# Patient Record
Sex: Female | Born: 1970 | Race: White | Hispanic: No | Marital: Married | Smoking: Never smoker
Health system: Southern US, Community
[De-identification: ages and names within clinical notes are randomized; demographics above are authoritative.]

## PROBLEM LIST (undated history)

## (undated) DIAGNOSIS — R1012 Left upper quadrant pain: Secondary | ICD-10-CM

## (undated) DIAGNOSIS — M7989 Other specified soft tissue disorders: Secondary | ICD-10-CM

## (undated) DIAGNOSIS — D1800 Hemangioma unspecified site: Secondary | ICD-10-CM

## (undated) DIAGNOSIS — M2550 Pain in unspecified joint: Secondary | ICD-10-CM

## (undated) DIAGNOSIS — J302 Other seasonal allergic rhinitis: Secondary | ICD-10-CM

## (undated) DIAGNOSIS — R519 Headache, unspecified: Secondary | ICD-10-CM

## (undated) DIAGNOSIS — K5790 Diverticulosis of intestine, part unspecified, without perforation or abscess without bleeding: Secondary | ICD-10-CM

## (undated) DIAGNOSIS — I493 Ventricular premature depolarization: Secondary | ICD-10-CM

## (undated) DIAGNOSIS — M1990 Unspecified osteoarthritis, unspecified site: Secondary | ICD-10-CM

## (undated) DIAGNOSIS — Z9889 Other specified postprocedural states: Secondary | ICD-10-CM

## (undated) DIAGNOSIS — N390 Urinary tract infection, site not specified: Secondary | ICD-10-CM

## (undated) DIAGNOSIS — R609 Edema, unspecified: Secondary | ICD-10-CM

## (undated) DIAGNOSIS — E782 Mixed hyperlipidemia: Secondary | ICD-10-CM

## (undated) DIAGNOSIS — I7300 Raynaud's syndrome without gangrene: Secondary | ICD-10-CM

## (undated) DIAGNOSIS — K5792 Diverticulitis of intestine, part unspecified, without perforation or abscess without bleeding: Secondary | ICD-10-CM

## (undated) DIAGNOSIS — I8390 Asymptomatic varicose veins of unspecified lower extremity: Secondary | ICD-10-CM

## (undated) DIAGNOSIS — K760 Fatty (change of) liver, not elsewhere classified: Secondary | ICD-10-CM

## (undated) DIAGNOSIS — K219 Gastro-esophageal reflux disease without esophagitis: Secondary | ICD-10-CM

## (undated) DIAGNOSIS — K829 Disease of gallbladder, unspecified: Secondary | ICD-10-CM

## (undated) DIAGNOSIS — F439 Reaction to severe stress, unspecified: Secondary | ICD-10-CM

## (undated) DIAGNOSIS — K068 Other specified disorders of gingiva and edentulous alveolar ridge: Secondary | ICD-10-CM

## (undated) DIAGNOSIS — N63 Unspecified lump in unspecified breast: Secondary | ICD-10-CM

## (undated) DIAGNOSIS — L659 Nonscarring hair loss, unspecified: Secondary | ICD-10-CM

## (undated) DIAGNOSIS — K625 Hemorrhage of anus and rectum: Secondary | ICD-10-CM

## (undated) DIAGNOSIS — Z87442 Personal history of urinary calculi: Secondary | ICD-10-CM

## (undated) DIAGNOSIS — R112 Nausea with vomiting, unspecified: Secondary | ICD-10-CM

## (undated) DIAGNOSIS — R51 Headache: Secondary | ICD-10-CM

## (undated) DIAGNOSIS — K649 Unspecified hemorrhoids: Secondary | ICD-10-CM

## (undated) HISTORY — DX: Diverticulosis of intestine, part unspecified, without perforation or abscess without bleeding: K57.90

## (undated) HISTORY — DX: Unspecified lump in unspecified breast: N63.0

## (undated) HISTORY — DX: Gastro-esophageal reflux disease without esophagitis: K21.9

## (undated) HISTORY — DX: Disease of gallbladder, unspecified: K82.9

## (undated) HISTORY — DX: Raynaud's syndrome without gangrene: I73.00

## (undated) HISTORY — DX: Other specified soft tissue disorders: M79.89

## (undated) HISTORY — DX: Left upper quadrant pain: R10.12

## (undated) HISTORY — DX: Pain in unspecified joint: M25.50

## (undated) HISTORY — DX: Ventricular premature depolarization: I49.3

## (undated) HISTORY — PX: CHOLECYSTECTOMY: SHX55

## (undated) HISTORY — DX: Unspecified hemorrhoids: K64.9

## (undated) HISTORY — DX: Reaction to severe stress, unspecified: F43.9

## (undated) HISTORY — DX: Edema, unspecified: R60.9

## (undated) HISTORY — DX: Diverticulitis of intestine, part unspecified, without perforation or abscess without bleeding: K57.92

## (undated) HISTORY — DX: Other specified disorders of gingiva and edentulous alveolar ridge: K06.8

## (undated) HISTORY — DX: Asymptomatic varicose veins of unspecified lower extremity: I83.90

## (undated) HISTORY — DX: Fatty (change of) liver, not elsewhere classified: K76.0

## (undated) HISTORY — PX: OTHER SURGICAL HISTORY: SHX169

## (undated) HISTORY — DX: Hemangioma unspecified site: D18.00

## (undated) HISTORY — DX: Urinary tract infection, site not specified: N39.0

## (undated) HISTORY — DX: Mixed hyperlipidemia: E78.2

## (undated) HISTORY — DX: Nonscarring hair loss, unspecified: L65.9

## (undated) HISTORY — DX: Hemorrhage of anus and rectum: K62.5

---

## 2009-07-13 ENCOUNTER — Other Ambulatory Visit: Admission: RE | Admit: 2009-07-13 | Discharge: 2009-07-13 | Payer: Self-pay | Admitting: Obstetrics and Gynecology

## 2011-01-08 ENCOUNTER — Ambulatory Visit (INDEPENDENT_AMBULATORY_CARE_PROVIDER_SITE_OTHER): Payer: Self-pay | Admitting: Internal Medicine

## 2011-01-08 ENCOUNTER — Encounter (INDEPENDENT_AMBULATORY_CARE_PROVIDER_SITE_OTHER): Payer: Self-pay | Admitting: Internal Medicine

## 2011-01-08 ENCOUNTER — Ambulatory Visit (INDEPENDENT_AMBULATORY_CARE_PROVIDER_SITE_OTHER): Payer: BC Managed Care – PPO | Admitting: Internal Medicine

## 2011-01-08 ENCOUNTER — Other Ambulatory Visit (INDEPENDENT_AMBULATORY_CARE_PROVIDER_SITE_OTHER): Payer: Self-pay | Admitting: *Deleted

## 2011-01-08 ENCOUNTER — Encounter (INDEPENDENT_AMBULATORY_CARE_PROVIDER_SITE_OTHER): Payer: Self-pay | Admitting: *Deleted

## 2011-01-08 VITALS — BP 132/70 | HR 80 | Temp 98.4°F | Ht 64.0 in | Wt 167.7 lb

## 2011-01-08 DIAGNOSIS — K625 Hemorrhage of anus and rectum: Secondary | ICD-10-CM

## 2011-01-08 DIAGNOSIS — Z800 Family history of malignant neoplasm of digestive organs: Secondary | ICD-10-CM

## 2011-01-08 DIAGNOSIS — D739 Disease of spleen, unspecified: Secondary | ICD-10-CM

## 2011-01-08 NOTE — Patient Instructions (Signed)
Follow up pending colonoscopy

## 2011-01-08 NOTE — Progress Notes (Signed)
Subjective:     Patient ID: Holly Golden, female   DOB: October 15, 1970, 40 y.o.   MRN: [id number] Holly Golden is a 40 yr old female referred to our office by Holly Mourning NP for heme positive stools. She say Holly Golden last week and she was guaiac positive in the office. Holly Golden tells me that she is having rectal bleeding. She says some weeks she will have rectal bleeding 3-4 times a week. The bleeding use to only occur when she was constipated but now will occur even when she is not.  She sees the blood in the commode and she has dripped blood on the floor. She sees blood in the stool also. Always bright red rectal bleeding.  Grandmother had colon cancer in early 21s.  She has a 1st cousin newly diagnosed with colon cancer at age 81, stage 4, new diagnosis.  Appetite is good. No weight loss. No abdominal pain. Acid reflux frequently and she drinks milk.  No melena. HPI see hpi   Review of Systems see hpi No current outpatient prescriptions on file.   History reviewed. No pertinent past medical history. Past Surgical History  Procedure Date  . Cesarean section     1991  . Left tube and ovary removed for a benign mass.   . Cholecystectomy     2010  . Splenic hemangiomas    No Known Allergies History   Social History  . Marital Status: Unknown    Spouse Name: N/A    Number of Children: N/A  . Years of Education: N/A   Occupational History  . Not on file.   Social History Main Topics  . Smoking status: Never Smoker   . Smokeless tobacco: Not on file  . Alcohol Use: No  . Drug Use: No  . Sexually Active: Not on file   Other Topics Concern  . Not on file   Social History Narrative  . No narrative on file   History reviewed. No pertinent family history. Family Status  Relation Status Death Age  . Mother Alive     good health  . Father Alive     good health  . Brother Alive     seizures MVC. blood clots   No Known Allergies     Objective:   Physical Exam Filed Vitals:   01/08/11 1611  BP: 132/70  Pulse: 80  Temp: 98.4 F (36.9 C)  Height: 5\' 4"  (1.626 m)  Weight: 167 lb 11.2 oz (76.068 kg)    Alert and oriented. Skin warm and dry. Oral mucosa is moist. Natural teeth in good condition. Sclera anicteric, conjunctivae is pink. Thyroid not enlarged. No cervical lymphadenopathy. Lungs clear. Heart regular rate and rhythm.  Abdomen is soft. Bowel sounds are positive. No hepatomegaly. No abdominal masses felt. No tenderness.  No edema to lower extremities. Patient is alert and oriented.     Assessment:   Rectal bleeding. Colonic neoplasm, polyp, hemorrhoid, AVM need to be ruled. Family hx of colon cancer.    Plan:     Colonoscopy.The risks and benefits such as perforation, bleeding, and infection were reviewed with the patient and is agreeable. Patient is satisfied with the care she received today.

## 2011-01-09 ENCOUNTER — Other Ambulatory Visit (INDEPENDENT_AMBULATORY_CARE_PROVIDER_SITE_OTHER): Payer: Self-pay | Admitting: *Deleted

## 2011-01-09 DIAGNOSIS — K625 Hemorrhage of anus and rectum: Secondary | ICD-10-CM

## 2011-01-09 DIAGNOSIS — Z800 Family history of malignant neoplasm of digestive organs: Secondary | ICD-10-CM

## 2011-02-08 ENCOUNTER — Ambulatory Visit: Admit: 2011-02-08 | Payer: Self-pay | Admitting: Internal Medicine

## 2011-02-08 SURGERY — COLONOSCOPY
Anesthesia: Moderate Sedation

## 2011-02-12 ENCOUNTER — Encounter (INDEPENDENT_AMBULATORY_CARE_PROVIDER_SITE_OTHER): Payer: Self-pay | Admitting: *Deleted

## 2011-03-01 MED ORDER — BACITRACIN ZINC 500 UNIT/GM EX OINT
TOPICAL_OINTMENT | CUTANEOUS | Status: AC
  Filled 2011-03-01: qty 2.7

## 2011-03-01 MED ORDER — EPINEPHRINE HCL 1 MG/ML IJ SOLN
INTRAMUSCULAR | Status: AC
  Filled 2011-03-01: qty 1

## 2011-03-01 MED ORDER — CIPROFLOXACIN-DEXAMETHASONE 0.3-0.1 % OT SUSP
OTIC | Status: AC
  Filled 2011-03-01: qty 7.5

## 2011-03-01 MED ORDER — LIDOCAINE-EPINEPHRINE (PF) 1 %-1:200000 IJ SOLN
INTRAMUSCULAR | Status: AC
  Filled 2011-03-01: qty 10

## 2011-03-01 MED ORDER — OXYMETAZOLINE HCL 0.05 % NA SOLN
NASAL | Status: AC
  Filled 2011-03-01: qty 15

## 2011-03-28 MED ORDER — SODIUM CHLORIDE 0.45 % IV SOLN: Freq: Once | INTRAVENOUS | Status: DC

## 2011-03-29 ENCOUNTER — Ambulatory Visit (HOSPITAL_COMMUNITY)
Admission: RE | Admit: 2011-03-29 | Payer: BC Managed Care – PPO | Source: Ambulatory Visit | Admitting: Internal Medicine

## 2011-03-29 ENCOUNTER — Encounter (HOSPITAL_COMMUNITY): Admission: RE | Payer: Self-pay | Source: Ambulatory Visit

## 2011-03-29 SURGERY — COLONOSCOPY
Anesthesia: Moderate Sedation

## 2011-06-12 ENCOUNTER — Other Ambulatory Visit (INDEPENDENT_AMBULATORY_CARE_PROVIDER_SITE_OTHER): Payer: Self-pay | Admitting: *Deleted

## 2011-06-12 ENCOUNTER — Telehealth (INDEPENDENT_AMBULATORY_CARE_PROVIDER_SITE_OTHER): Payer: Self-pay | Admitting: *Deleted

## 2011-06-12 DIAGNOSIS — Z8719 Personal history of other diseases of the digestive system: Secondary | ICD-10-CM

## 2011-06-12 DIAGNOSIS — Z800 Family history of malignant neoplasm of digestive organs: Secondary | ICD-10-CM

## 2011-06-12 NOTE — Telephone Encounter (Signed)
Patient needs movi prep 

## 2011-06-13 MED ORDER — PEG-KCL-NACL-NASULF-NA ASC-C 100 G PO SOLR: 1.0000 | Freq: Once | ORAL | Status: DC

## 2011-07-18 ENCOUNTER — Telehealth (INDEPENDENT_AMBULATORY_CARE_PROVIDER_SITE_OTHER): Payer: Self-pay | Admitting: *Deleted

## 2011-07-18 NOTE — Telephone Encounter (Signed)
PCP/Requesting MD: howard  Name & DOB: Holly Golden 05-May-2070     Procedure: tcs  Reason/Indication:  fam hx colon ca, hx rectal bleeding  Has patient had this procedure before?  yes  If so, when, by whom and where?  More than 10 yrs ago  Is there a family history of colon cancer?  yes  Who?  What age when diagnosed?  Grandmother, cousin  Is patient diabetic?   no      Does patient have prosthetic heart valve?  no  Do you have a pacemaker?  no  Has patient had joint replacement within last 12 months?  no  Is patient on Coumadin, Plavix and/or Aspirin? no  Medications: advil prn  Allergies: nkda  Medication Adjustment:   Procedure date & time: 08/06/11 @ 730

## 2011-07-24 NOTE — Telephone Encounter (Signed)
agree

## 2011-08-02 ENCOUNTER — Encounter (HOSPITAL_COMMUNITY): Payer: Self-pay | Admitting: Pharmacy Technician

## 2011-08-02 ENCOUNTER — Other Ambulatory Visit: Payer: Self-pay | Admitting: Adult Health

## 2011-08-02 DIAGNOSIS — Z139 Encounter for screening, unspecified: Secondary | ICD-10-CM

## 2011-08-06 ENCOUNTER — Encounter (HOSPITAL_COMMUNITY): Payer: Self-pay | Admitting: *Deleted

## 2011-08-06 ENCOUNTER — Ambulatory Visit (HOSPITAL_COMMUNITY)
Admission: RE | Admit: 2011-08-06 | Discharge: 2011-08-06 | Disposition: A | Discharge status: Home / Self Care | Payer: BC Managed Care – PPO | Source: Ambulatory Visit | Attending: Internal Medicine | Admitting: Internal Medicine

## 2011-08-06 ENCOUNTER — Encounter (HOSPITAL_COMMUNITY)
Admission: RE | Disposition: A | Discharge status: Home / Self Care | Payer: Self-pay | Source: Ambulatory Visit | Attending: Internal Medicine

## 2011-08-06 DIAGNOSIS — Z800 Family history of malignant neoplasm of digestive organs: Secondary | ICD-10-CM

## 2011-08-06 DIAGNOSIS — D129 Benign neoplasm of anus and anal canal: Secondary | ICD-10-CM | POA: Insufficient documentation

## 2011-08-06 DIAGNOSIS — K921 Melena: Secondary | ICD-10-CM | POA: Insufficient documentation

## 2011-08-06 DIAGNOSIS — D126 Benign neoplasm of colon, unspecified: Secondary | ICD-10-CM | POA: Insufficient documentation

## 2011-08-06 DIAGNOSIS — K644 Residual hemorrhoidal skin tags: Secondary | ICD-10-CM

## 2011-08-06 DIAGNOSIS — K625 Hemorrhage of anus and rectum: Secondary | ICD-10-CM

## 2011-08-06 DIAGNOSIS — Z8719 Personal history of other diseases of the digestive system: Secondary | ICD-10-CM

## 2011-08-06 DIAGNOSIS — D128 Benign neoplasm of rectum: Secondary | ICD-10-CM | POA: Insufficient documentation

## 2011-08-06 HISTORY — PX: COLONOSCOPY: SHX5424

## 2011-08-06 HISTORY — DX: Other seasonal allergic rhinitis: J30.2

## 2011-08-06 SURGERY — COLONOSCOPY
Anesthesia: Moderate Sedation

## 2011-08-06 MED ORDER — MIDAZOLAM HCL 5 MG/5ML IJ SOLN
INTRAMUSCULAR | Status: DC | PRN
  Administered 2011-08-06: 1 mg via INTRAVENOUS
  Administered 2011-08-06: 2 mg via INTRAVENOUS
  Administered 2011-08-06 (×2): 1 mg via INTRAVENOUS
  Administered 2011-08-06: 2 mg via INTRAVENOUS

## 2011-08-06 MED ORDER — MIDAZOLAM HCL 5 MG/5ML IJ SOLN
INTRAMUSCULAR | Status: AC
  Filled 2011-08-06: qty 10

## 2011-08-06 MED ORDER — MEPERIDINE HCL 50 MG/ML IJ SOLN
INTRAMUSCULAR | Status: DC | PRN
  Administered 2011-08-06 (×2): 25 mg via INTRAVENOUS

## 2011-08-06 MED ORDER — BENEFIBER PO PACK: 1.0000 | PACK | Freq: Every day | ORAL | Status: AC

## 2011-08-06 MED ORDER — SODIUM CHLORIDE 0.45 % IV SOLN: Freq: Once | INTRAVENOUS | Status: DC

## 2011-08-06 MED ORDER — SODIUM CHLORIDE 0.45 % IV SOLN
Freq: Once | INTRAVENOUS | Status: AC
  Administered 2011-08-06: 1000 mL via INTRAVENOUS

## 2011-08-06 MED ORDER — MEPERIDINE HCL 50 MG/ML IJ SOLN
INTRAMUSCULAR | Status: AC
  Filled 2011-08-06: qty 1

## 2011-08-06 MED ORDER — HYDROCORTISONE ACETATE 25 MG RE SUPP: 25.0000 mg | Freq: Every day | RECTAL | Status: AC

## 2011-08-06 NOTE — Discharge Instructions (Signed)
Resume usual medications and high fiber diet. No driving for 24 hours. Physician will contact you with biopsy results. Keep  written record of bleeding episodes over the next 3 months and call with progress report.   Colonoscopy Care After Read the instructions outlined below and refer to this sheet in the next few weeks. These discharge instructions provide you with general information on caring for yourself after you leave the hospital. Your doctor may also give you specific instructions. While your treatment has been planned according to the most current medical practices available, unavoidable complications occasionally occur. If you have any problems or questions after discharge, call your doctor. HOME CARE INSTRUCTIONS ACTIVITY:  You may resume your regular activity, but move at a slower pace for the next 24 hours.   Take frequent rest periods for the next 24 hours.   Walking will help get rid of the air and reduce the bloated feeling in your belly (abdomen).   No driving for 24 hours (because of the medicine (anesthesia) used during the test).   You may shower.   Do not sign any important legal documents or operate any machinery for 24 hours (because of the anesthesia used during the test).  NUTRITION:  Drink plenty of fluids.   You may resume your normal diet as instructed by your doctor.   Begin with a light meal and progress to your normal diet. Heavy or fried foods are harder to digest and may make you feel sick to your stomach (nauseated).   Avoid alcoholic beverages for 24 hours or as instructed.  MEDICATIONS:  You may resume your normal medications unless your doctor tells you otherwise.  WHAT TO EXPECT TODAY:  Some feelings of bloating in the abdomen.   Passage of more gas than usual.   Spotting of blood in your stool or on the toilet paper.  IF YOU HAD POLYPS REMOVED DURING THE COLONOSCOPY:  No aspirin products for 7 days or as instructed.   No alcohol  for 7 days or as instructed.   Eat a soft diet for the next 24 hours.  FINDING OUT THE RESULTS OF YOUR TEST Not all test results are available during your visit. If your test results are not back during the visit, make an appointment with your caregiver to find out the results. Do not assume everything is normal if you have not heard from your caregiver or the medical facility. It is important for you to follow up on all of your test results.  SEEK IMMEDIATE MEDICAL CARE IF:  You have more than a spotting of blood in your stool.   Your belly is swollen (abdominal distention).   You are nauseated or vomiting.   You have a fever.   You have abdominal pain or discomfort that is severe or gets worse throughout the day.  Document Released: 10/04/2003 Document Revised: 02/08/2011 Document Reviewed: 10/02/2007 [facility] Patient Information 2012 [place], [place].   PATIENT INSTRUCTIONS POST-ANESTHESIA  IMMEDIATELY FOLLOWING SURGERY:  Do not drive or operate machinery for the first twenty four hours after surgery.  Do not make any important decisions for twenty four hours after surgery or while taking narcotic pain medications or sedatives.  If you develop intractable nausea and vomiting or a severe headache please notify your doctor immediately.  FOLLOW-UP:  Please make an appointment with your surgeon as instructed. You do not need to follow up with anesthesia unless specifically instructed to do so.  WOUND CARE INSTRUCTIONS (if applicable):  Keep a  dry clean dressing on the anesthesia/puncture wound site if there is drainage.  Once the wound has quit draining you may leave it open to air.  Generally you should leave the bandage intact for twenty four hours unless there is drainage.  If the epidural site drains for more than 36-48 hours please call the anesthesia department.  QUESTIONS?:  Please feel free to call your physician or the hospital operator if you have any questions, and they will be  happy to assist you.

## 2011-08-06 NOTE — Op Note (Signed)
COLONOSCOPY PROCEDURE REPORT  PATIENT:  Holly Golden  MR#:  [id number] Birthdate:  1970/03/30, 41 y.o., female Endoscopist:  Dr. Malissa Hippo, MD Referred By:  Dr. Selinda Flavin, MD Procedure Date: 08/06/2011  Procedure:   Colonoscopy  Indications:  Patient is 41 year old Caucasian female with recurrent hematochezia. Family history is positive for colon carcinoma in 2 second-degree relatives.  Informed Consent:  The procedure and risks were reviewed with the patient and informed consent was obtained.  Medications:  Demerol 50 mg IV Versed 7 mg IV  Description of procedure:  After a digital rectal exam was performed, that colonoscope was advanced from the anus through the rectum and colon to the area of the cecum, ileocecal valve and appendiceal orifice. The cecum was deeply intubated. These structures were well-seen and photographed for the record. From the level of the cecum and ileocecal valve, the scope was slowly and cautiously withdrawn. The mucosal surfaces were carefully surveyed utilizing scope tip to flexion to facilitate fold flattening as needed. The scope was pulled down into the rectum where a thorough exam including retroflexion was performed. Terminal ileum was also examined.  Findings:   Prep was satisfactory. More than 300 mL of liquid prep was suctioned out. Normal terminal ileum. Two small polyps ablated via cold biopsy and submitted in one container. One was located at sigmoid colon and the second one and rectosigmoid junction. Normal rectal mucosa. Small hemorrhoids below the dentate line.  Therapeutic/Diagnostic Maneuvers Performed:  See above  Complications:  None  Cecal Withdrawal Time:  12 minutes  Impression:  Normal terminal ileum. Two small polyps ablated via cold biopsy and submitted together(sigmoid colon and rectosigmoid junction). External hemorrhoids felt to be source of hematochezia.  Recommendations:  High fiber diet. Fiber supplement 4 g by  mouth daily. Anusol-HC suppository 1 per rectum daily at bedtime for two weeks. I will contact patient with results of biopsy and further recommendations.  Alphus Zeck U  08/06/2011 8:25 AM  CC: Dr. Selinda Flavin, MD, MD & Dr. Bonnetta Barry ref. provider found

## 2011-08-06 NOTE — H&P (Signed)
Holly Golden is an 41 y.o. female.   Chief Complaint: Patient is here for colonoscopy. HPI: Patient is 41 year old Caucasian female who presents with history of intermittent hematochezia. This has been going on for several months. It comes and episodes she may be daily with bowel movements for several days each time. She denies diarrhea and/or constipation. She also denies abdominal pain anorexia weight loss. Patient's last colonoscopy was about 10 years ago. Family history is significant for colon carcinoma in first cousin was diagnosed at age 14 and died at 55 and grandmother was diagnosed at age 28.  Past Medical History  Diagnosis Date  . Seasonal allergies     Past Surgical History  Procedure Date  . Cesarean section     1991  . Left tube and ovary removed for a benign mass.   . Cholecystectomy     2010  . Splenic hemangiomas     History reviewed. No pertinent family history. Social History:  reports that she has never smoked. She does not have any smokeless tobacco history on file. She reports that she does not drink alcohol or use illicit drugs.  Allergies: No Known Allergies  Medications Prior to Admission  Medication Sig Dispense Refill  . ibuprofen (ADVIL,MOTRIN) 200 MG tablet Take 600 mg by mouth every 6 (six) hours as needed.      . peg 3350 powder (MOVIPREP) SOLR Take 1 kit (100 g total) by mouth once.  1 kit  0  . cetirizine (ZYRTEC) 10 MG tablet Take 10 mg by mouth daily.        No results found for this or any previous visit (from the past 48 hour(s)). No results found.  ROS  Blood pressure 111/74, pulse 81, temperature 98.4 F (36.9 C), temperature source Oral, resp. rate 18, height 5\' 4"  (1.626 m), weight 155 lb (70.308 kg), last menstrual period 07/09/2011, SpO2 100.00%. Physical Exam  Constitutional: She appears well-developed and well-nourished.  HENT:  Mouth/Throat: Oropharynx is clear and moist.  Eyes: Conjunctivae are normal.  Neck: No thyromegaly  present.  Cardiovascular: Normal rate, regular rhythm and normal heart sounds.   No murmur heard. Respiratory: Effort normal and breath sounds normal.  GI: Soft. She exhibits no distension and no mass. There is no tenderness.  Musculoskeletal: She exhibits no edema.  Lymphadenopathy:    She has no cervical adenopathy.  Neurological: She is alert.  Skin: Skin is warm and dry.     Assessment/Plan Hematochezia. Family history of colon carcinoma in two second-degree relatives.  Linsi Humann U 08/06/2011, 7:34 AM

## 2011-08-07 ENCOUNTER — Ambulatory Visit (HOSPITAL_COMMUNITY)
Admission: RE | Admit: 2011-08-07 | Discharge: 2011-08-07 | Disposition: A | Discharge status: Home / Self Care | Payer: BC Managed Care – PPO | Source: Ambulatory Visit | Attending: Adult Health | Admitting: Adult Health

## 2011-08-07 DIAGNOSIS — Z139 Encounter for screening, unspecified: Secondary | ICD-10-CM

## 2011-08-07 DIAGNOSIS — Z1231 Encounter for screening mammogram for malignant neoplasm of breast: Secondary | ICD-10-CM | POA: Insufficient documentation

## 2011-08-09 ENCOUNTER — Encounter (HOSPITAL_COMMUNITY): Payer: Self-pay | Admitting: Internal Medicine

## 2011-08-13 ENCOUNTER — Encounter (INDEPENDENT_AMBULATORY_CARE_PROVIDER_SITE_OTHER): Payer: Self-pay | Admitting: *Deleted

## 2011-11-21 ENCOUNTER — Other Ambulatory Visit: Payer: Self-pay | Admitting: Adult Health

## 2011-11-21 ENCOUNTER — Other Ambulatory Visit (HOSPITAL_COMMUNITY)
Admission: RE | Admit: 2011-11-21 | Discharge: 2011-11-21 | Disposition: A | Discharge status: Home / Self Care | Payer: BC Managed Care – PPO | Source: Ambulatory Visit | Attending: Obstetrics and Gynecology | Admitting: Obstetrics and Gynecology

## 2011-11-21 DIAGNOSIS — Z1151 Encounter for screening for human papillomavirus (HPV): Secondary | ICD-10-CM | POA: Insufficient documentation

## 2011-11-21 DIAGNOSIS — Z01419 Encounter for gynecological examination (general) (routine) without abnormal findings: Secondary | ICD-10-CM | POA: Insufficient documentation

## 2011-11-21 DIAGNOSIS — R1012 Left upper quadrant pain: Secondary | ICD-10-CM

## 2011-11-29 ENCOUNTER — Ambulatory Visit (HOSPITAL_COMMUNITY)
Admission: RE | Admit: 2011-11-29 | Discharge: 2011-11-29 | Disposition: A | Discharge status: Home / Self Care | Payer: BC Managed Care – PPO | Source: Ambulatory Visit | Attending: Adult Health | Admitting: Adult Health

## 2011-11-29 DIAGNOSIS — R935 Abnormal findings on diagnostic imaging of other abdominal regions, including retroperitoneum: Secondary | ICD-10-CM | POA: Insufficient documentation

## 2011-11-29 DIAGNOSIS — R1012 Left upper quadrant pain: Secondary | ICD-10-CM | POA: Insufficient documentation

## 2012-08-14 ENCOUNTER — Encounter: Payer: Self-pay | Admitting: *Deleted

## 2012-08-15 ENCOUNTER — Encounter: Payer: Self-pay | Admitting: Adult Health

## 2012-08-15 ENCOUNTER — Ambulatory Visit (INDEPENDENT_AMBULATORY_CARE_PROVIDER_SITE_OTHER): Payer: BC Managed Care – PPO | Admitting: Adult Health

## 2012-08-15 VITALS — BP 120/80 | Ht 64.0 in | Wt 162.0 lb

## 2012-08-15 DIAGNOSIS — N6459 Other signs and symptoms in breast: Secondary | ICD-10-CM

## 2012-08-15 DIAGNOSIS — N63 Unspecified lump in unspecified breast: Secondary | ICD-10-CM

## 2012-08-15 DIAGNOSIS — E7800 Pure hypercholesterolemia, unspecified: Secondary | ICD-10-CM

## 2012-08-15 HISTORY — DX: Unspecified lump in unspecified breast: N63.0

## 2012-08-15 LAB — LIPID PANEL
LDL Cholesterol: 73 mg/dL (ref 0–99)
VLDL: 50 mg/dL — ABNORMAL HIGH (ref 0–40)

## 2012-08-15 LAB — COMPREHENSIVE METABOLIC PANEL
AST: 13 U/L (ref 0–37)
Albumin: 4.5 g/dL (ref 3.5–5.2)
Alkaline Phosphatase: 56 U/L (ref 39–117)
Calcium: 9.1 mg/dL (ref 8.4–10.5)
Chloride: 107 mEq/L (ref 96–112)
Glucose, Bld: 87 mg/dL (ref 70–99)
Potassium: 4.8 mEq/L (ref 3.5–5.3)
Sodium: 138 mEq/L (ref 135–145)
Total Protein: 6.9 g/dL (ref 6.0–8.3)

## 2012-08-15 LAB — CBC
HCT: 40.7 % (ref 36.0–46.0)
Hemoglobin: 13.7 g/dL (ref 12.0–15.0)
MCHC: 33.7 g/dL (ref 30.0–36.0)
RDW: 13.9 % (ref 11.5–15.5)
WBC: 4 10*3/uL (ref 4.0–10.5)

## 2012-08-15 NOTE — Progress Notes (Signed)
Subjective:     Patient ID: Holly Golden, female   DOB: 1970/08/29, 42 y.o.   MRN: [id number]  HPI Holly Golden is a 42 year old white female in complaining of a lump in left breast,she has noticed it for about 3 weeks.and she wants fasting labs.  Review of Systems Positives as in HPI  Reviewed past medical,surgical, social and family history. Reviewed medications and allergies.     Objective:   Physical Exam BP 120/80  Ht 5\' 4"  (1.626 m)  Wt 162 lb (73.483 kg)  BMI 27.79 kg/m2  LMP 05/26/2014Skin warm and dry.Breast: right-no dominant mass,retraction or nipple discharge, left-no retraction or nipple discharge, there is a 1cm round tender nodule at 2o'clock, about 3 finger breaths from the areola.Has fairly firm breast.     Assessment:    Left breast nodule History elevated cholesterol    Plan:     Check CBC,CMP,TSH, lipids Schedule diagnostic bilateral mammogram and left breast US if needed at [facility] 6/18 at 3:30 pm. Follow up prn,or physical

## 2012-08-15 NOTE — Patient Instructions (Addendum)
Follow up  For physical

## 2012-08-18 ENCOUNTER — Telehealth: Payer: Self-pay | Admitting: Adult Health

## 2012-08-18 NOTE — Telephone Encounter (Signed)
Pt aware of labs and need to exercise, watch diet and take krell oil and recheck in 3 months

## 2012-08-20 ENCOUNTER — Ambulatory Visit (HOSPITAL_COMMUNITY)
Admission: RE | Admit: 2012-08-20 | Discharge: 2012-08-20 | Disposition: A | Discharge status: Home / Self Care | Payer: BC Managed Care – PPO | Source: Ambulatory Visit | Attending: Adult Health | Admitting: Adult Health

## 2012-08-20 DIAGNOSIS — N63 Unspecified lump in unspecified breast: Secondary | ICD-10-CM

## 2012-09-12 ENCOUNTER — Ambulatory Visit: Payer: BC Managed Care – PPO | Admitting: Adult Health

## 2012-09-26 ENCOUNTER — Ambulatory Visit: Payer: BC Managed Care – PPO | Admitting: Adult Health

## 2012-11-17 ENCOUNTER — Ambulatory Visit (INDEPENDENT_AMBULATORY_CARE_PROVIDER_SITE_OTHER): Payer: BC Managed Care – PPO | Admitting: Adult Health

## 2012-11-17 ENCOUNTER — Encounter: Payer: Self-pay | Admitting: Adult Health

## 2012-11-17 VITALS — BP 116/78 | Ht 64.0 in | Wt 165.0 lb

## 2012-11-17 DIAGNOSIS — M7989 Other specified soft tissue disorders: Secondary | ICD-10-CM

## 2012-11-17 HISTORY — DX: Other specified soft tissue disorders: M79.89

## 2012-11-17 NOTE — Progress Notes (Signed)
Subjective:     Patient ID: Holly Golden, female   DOB: 1971-02-17, 42 y.o.   MRN: [id number]  HPI Holly Golden is in complaining of bilateral swelling in her hands and they are cold except in am that are warm,they are tight and stiff, no one joint swelling.Her feet swells some too, esp in hot weather.Has some changes in color too.has history of hemangiomas in spleen and feels like spleen enlarged if bends down.  Review of Systems Positives in HPI Reviewed past medical,surgical, social and family history. Reviewed medications and allergies.     Objective:   Physical Exam BP 116/78  Ht 5\' 4"  (1.626 m)  Wt 165 lb (74.844 kg)  BMI 28.31 kg/m2  LMP 10/23/2012   fingers are puffy, cool to touch but nails blanch well no line in nails. Abdomen soft ?mild spleen enlargement.  Assessment:     Swelling and cold hands   History of hemangiomas of spleen  Plan:     Check CBC with diff,CMP,RF,ANA and ESR Follow up labs in 24-48 hours Keep hands warm

## 2012-11-17 NOTE — Patient Instructions (Addendum)
Follow up labs in 48 hours Keep hands warm

## 2012-11-18 LAB — CBC WITH DIFFERENTIAL/PLATELET
Basophils Absolute: 0 10*3/uL (ref 0.0–0.1)
Basophils Relative: 0 % (ref 0–1)
Lymphocytes Relative: 41 % (ref 12–46)
MCHC: 33.7 g/dL (ref 30.0–36.0)
Neutro Abs: 2.8 10*3/uL (ref 1.7–7.7)
Neutrophils Relative %: 51 % (ref 43–77)
Platelets: 195 10*3/uL (ref 150–400)
RDW: 13.6 % (ref 11.5–15.5)
WBC: 5.5 10*3/uL (ref 4.0–10.5)

## 2012-11-18 LAB — COMPREHENSIVE METABOLIC PANEL
AST: 15 U/L (ref 0–37)
Alkaline Phosphatase: 58 U/L (ref 39–117)
BUN: 12 mg/dL (ref 6–23)
Calcium: 9.7 mg/dL (ref 8.4–10.5)
Potassium: 4.2 mEq/L (ref 3.5–5.3)
Sodium: 137 mEq/L (ref 135–145)

## 2012-11-18 LAB — RHEUMATOID FACTOR: Rhuematoid fact SerPl-aCnc: <10 IU/mL (ref <=14)

## 2012-11-18 LAB — SEDIMENTATION RATE: Sed Rate: 6 mm/hr (ref 0–22)

## 2012-11-19 ENCOUNTER — Telehealth: Payer: Self-pay | Admitting: Adult Health

## 2012-11-19 NOTE — Telephone Encounter (Signed)
Discussed labs with pt Cut out salt and decrease caffeine and if persists will get cardiac consult

## 2012-12-10 ENCOUNTER — Other Ambulatory Visit: Payer: Self-pay | Admitting: Adult Health

## 2012-12-10 DIAGNOSIS — R609 Edema, unspecified: Secondary | ICD-10-CM

## 2012-12-25 ENCOUNTER — Ambulatory Visit: Payer: BC Managed Care – PPO | Admitting: Cardiology

## 2012-12-26 ENCOUNTER — Encounter: Payer: Self-pay | Admitting: Cardiology

## 2012-12-26 ENCOUNTER — Ambulatory Visit (INDEPENDENT_AMBULATORY_CARE_PROVIDER_SITE_OTHER): Payer: BC Managed Care – PPO | Admitting: Cardiology

## 2012-12-26 VITALS — BP 121/80 | HR 73 | Ht 65.0 in | Wt 162.8 lb

## 2012-12-26 DIAGNOSIS — Z136 Encounter for screening for cardiovascular disorders: Secondary | ICD-10-CM

## 2012-12-26 NOTE — Progress Notes (Signed)
Clinical Summary Ms. Dolinski is a 42 y.o.female referred for edema  1. Edema - edema noted in hands and feet - symptoms started about 1 year ago, but over last 6 months significant change - hands and feet. Has pain and stiffness in hands and feet in mornings, gets somewhat better as the day goes on. Notes increased redness in hands and feet as well. No weakness. No fevers or rash.  - no chest pain, no SOB, no DOE. No orthopnea, no PND.  - reports 1st cousin with rheumatoid arthritis. No tick bites. No personal history of blood clots, though reports brother has had blood clots, similar history for her cousin. No history of miscarraiges       Past Medical History  Diagnosis Date  . Seasonal allergies   . UTI (urinary tract infection)   . Kidney stones   . 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 Hemorrhoids   . Breast nodule 08/15/2012    Left breast nodule at 2 oclock will get mammogram and [place]  . Swelling of hand 11/17/2012    Has bilateral swelling in hands, and they are cold     No Known Allergies   Current Outpatient Prescriptions  Medication Sig Dispense Refill  . AMOXICILLIN PO Take 800 mg by mouth 2 (two) times daily. 4 tsp bid      . cetirizine (ZYRTEC) 10 MG tablet Take 10 mg by mouth daily.      Marland Kitchen ibuprofen (ADVIL,MOTRIN) 200 MG tablet Take 600 mg by mouth every 6 (six) hours as needed.       No current facility-administered medications for this visit.     Past Surgical History  Procedure Laterality Date  . Cesarean section      1991  . Left tube and ovary removed for a benign mass.    . Cholecystectomy      2010  . Splenic hemangiomas    . Colonoscopy  08/06/2011    Procedure: COLONOSCOPY;  Surgeon: Malissa Hippo, MD;  Location: [facility];  Service: Endoscopy;  Laterality: N/A;  730     No Known Allergies    Family History  Problem Relation Age of Onset  . Cancer Maternal Grandmother     pancreatic  . Cancer Paternal Grandmother       breast and colon  . Seizures Mother   . Seizures Brother   . Stroke Paternal Grandfather   . Cancer Cousin     colon, hodgkin's  . Diabetes Cousin      Social History Ms. Sarchet reports that she has never smoked. She has never used smokeless tobacco. Ms. Switala reports that she does not drink alcohol.   Review of Systems CONSTITUTIONAL: No weight loss, fever, chills, weakness or fatigue.  HEENT: Eyes: No visual loss, blurred vision, double vision or yellow sclerae.No hearing loss, sneezing, congestion, runny nose or sore throat.  SKIN: No rash or itching.  CARDIOVASCULAR: per HPI RESPIRATORY: No shortness of breath, cough or sputum.  GASTROINTESTINAL: No anorexia, nausea, vomiting or diarrhea. No abdominal pain or blood.  GENITOURINARY: No burning on urination, no polyuria NEUROLOGICAL: No headache, dizziness, syncope, paralysis, ataxia, numbness or tingling in the extremities. No change in bowel or bladder control.  MUSCULOSKELETAL: per HPI  LYMPHATICS: No enlarged nodes. No history of splenectomy.  PSYCHIATRIC: No history of depression or anxiety.  ENDOCRINOLOGIC: No reports of sweating, cold or heat intolerance. No polyuria or polydipsia.  Marland Kitchen  Physical Examination p 73 bp 121/80 Wt 162 lbs BMI 27 99% sat Gen: resting comfortably, no acute distress HEENT: no scleral icterus, pupils equal round and reactive, no palptable cervical adenopathy,  CV: RRR, no m/r,g, no JVD, no carotid bruits Resp: Clear to auscultation bilaterally GI: abdomen is soft, non-tender, non-distended, normal bowel sounds, no hepatosplenomegaly MSK: extremities are warm, no edema.  Skin: warm, no rash Neuro:  no focal deficits Psych: appropriate affect   Diagnostic Studies Pertinent labs 11/2012: Na 137 Cr 0.73 K 4.2 BUN 12 ANA neg RF neg ESR 6 Hgb 13.8 Plt 195 TSH 2.95    Assessment and Plan  1. Edema - patient with swelling of hands and feet associated with redness, pain, and morning  stiffness. Symptoms most consistent with an autoimmune/rheumatologic process. Denies any SOB, orthopnea, PND, or pitting lower extremity edema. This pattern of edema and symptoms not typical of cardiac related swelling. At this time do not recommend any further cardiac workup, recommend evaluation by rheumatology. - please reconsult if symptoms change     Antoine Poche, M.D., F.A.C.C.

## 2012-12-26 NOTE — Patient Instructions (Signed)
Your physician recommends that you schedule a follow-up appointment in: as needed Your physician recommends that you continue on your current medications as directed. Please refer to the Current Medication list given to you today.   

## 2013-01-07 ENCOUNTER — Other Ambulatory Visit: Payer: Self-pay | Admitting: Adult Health

## 2013-01-07 DIAGNOSIS — R609 Edema, unspecified: Secondary | ICD-10-CM

## 2013-01-08 ENCOUNTER — Other Ambulatory Visit: Payer: Self-pay

## 2013-03-02 ENCOUNTER — Other Ambulatory Visit: Payer: Self-pay | Admitting: Adult Health

## 2013-03-02 ENCOUNTER — Telehealth: Payer: Self-pay | Admitting: Adult Health

## 2013-03-02 MED ORDER — NITROFURANTOIN MONOHYD MACRO 100 MG PO CAPS: 100.0000 mg | ORAL_CAPSULE | Freq: Two times a day (BID) | ORAL | Status: DC

## 2013-03-02 NOTE — Telephone Encounter (Signed)
Has had frequency with urination had GI bug the weekend, urine showed blood and protein when dipped will rx macrobid push fluids

## 2013-05-06 ENCOUNTER — Other Ambulatory Visit: Payer: Self-pay | Admitting: *Deleted

## 2013-05-06 DIAGNOSIS — I83893 Varicose veins of bilateral lower extremities with other complications: Secondary | ICD-10-CM

## 2013-05-19 ENCOUNTER — Encounter: Payer: Self-pay | Admitting: Vascular Surgery

## 2013-05-20 ENCOUNTER — Ambulatory Visit (HOSPITAL_COMMUNITY)
Admission: RE | Admit: 2013-05-20 | Discharge: 2013-05-20 | Disposition: A | Discharge status: Home / Self Care | Payer: BC Managed Care – PPO | Source: Ambulatory Visit | Attending: Vascular Surgery | Admitting: Vascular Surgery

## 2013-05-20 ENCOUNTER — Encounter: Payer: Self-pay | Admitting: Vascular Surgery

## 2013-05-20 ENCOUNTER — Ambulatory Visit (INDEPENDENT_AMBULATORY_CARE_PROVIDER_SITE_OTHER): Payer: BC Managed Care – PPO | Admitting: Vascular Surgery

## 2013-05-20 VITALS — BP 125/75 | HR 77 | Ht 65.0 in | Wt 163.5 lb

## 2013-05-20 DIAGNOSIS — I83893 Varicose veins of bilateral lower extremities with other complications: Secondary | ICD-10-CM

## 2013-05-20 NOTE — Assessment & Plan Note (Signed)
This patient has symptomatic varicose veins bilaterally with significant chronic venous insufficiency. We have discussed the importance of intermittent leg elevation and the proper positioning for this. In addition I have written her a prescription for a thigh high compression stockings with a gradient of 20-30 mm mercury. I've encouraged her to avoid prolonged sitting and standing. I have encouraged her to walk as much as possible. I've explained and also water aerobics can sometimes be helpful for patients with chronic venous insufficiency. We will have her follow up in 3 months. If she is continuing to have symptoms despite her thigh high compression stockings she could potentially be considered for laser ablation of her greater saphenous vein on the left. His is the more symptomatic side.

## 2013-05-20 NOTE — Progress Notes (Signed)
Vascular and Vein Specialist of [facility]  Patient name: Holly Golden MRN: [id number] DOB: Jul 02, 1970 Sex: female  REASON FOR CONSULT: Painful varicose veins bilaterally.  HPI: Holly Golden is a 43 y.o. female with a Norenberg history of varicose veins. She began having problems in 1998 and her symptoms gradually progressed. She experiences aching pain and heaviness in both lower extremities with standing. Her symptoms are more significant on the left side. She works in a [employer] and is standing or sitting most of her day. Symptoms are aggravated by standing and relieved somewhat with elevation. She has not been wearing compression stockings. He denies any history of DVT or phlebitis.   Past Medical History  Diagnosis Date  . Seasonal allergies   . UTI (urinary tract infection)   . Kidney stones   . 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 Hemorrhoids   . Breast nodule 08/15/2012    Left breast nodule at 2 oclock will get mammogram and [place]  . Swelling of hand 11/17/2012    Has bilateral swelling in hands, and they are cold   Family History  Problem Relation Age of Onset  . Cancer Maternal Grandmother     pancreatic  . Cancer Paternal Grandmother     breast and colon  . Seizures Mother   . Seizures Brother   . Stroke Paternal Grandfather   . Cancer Cousin     colon, hodgkin's  . Diabetes Cousin    SOCIAL HISTORY: History  Substance Use Topics  . Smoking status: Never Smoker   . Smokeless tobacco: Never Used  . Alcohol Use: No   No Known Allergies Current Outpatient Prescriptions  Medication Sig Dispense Refill  . cetirizine (ZYRTEC) 10 MG tablet Take 10 mg by mouth daily.      Marland Kitchen ibuprofen (ADVIL,MOTRIN) 200 MG tablet Take 600 mg by mouth every 6 (six) hours as needed.      Marland Kitchen KRILL OIL PO Take 1 capsule by mouth daily.      . nitrofurantoin, macrocrystal-monohydrate, (MACROBID) 100 MG capsule Take 1 capsule (100 mg total) by mouth 2 (two) times daily.  14 capsule   0   No current facility-administered medications for this visit.   REVIEW OF SYSTEMS: Valu.Nieves ] denotes positive finding; [  ] denotes negative finding  CARDIOVASCULAR:  [ ]  chest pain   [ ]  chest pressure   [ ]  palpitations   [ ]  orthopnea   [ ]  dyspnea on exertion   [ ]  claudication   [ ]  rest pain   [ ]  DVT   [ ]  phlebitis PULMONARY:   [ ]  productive cough   [ ]  asthma   [ ]  wheezing NEUROLOGIC:   [ ]  weakness  [ ]  paresthesias  [ ]  aphasia  [ ]  amaurosis  [ ]  dizziness HEMATOLOGIC:   [ ]  bleeding problems   [ ]  clotting disorders MUSCULOSKELETAL:  [ ]  joint pain   [ ]  joint swelling [ ]  leg swelling GASTROINTESTINAL: [ ]   blood in stool  [ ]   hematemesis GENITOURINARY:  [ ]   dysuria  [ ]   hematuria PSYCHIATRIC:  [ ]  history of major depression INTEGUMENTARY:  [ ]  rashes  [ ]  ulcers CONSTITUTIONAL:  [ ]  fever   [ ]  chills  PHYSICAL EXAM: Filed Vitals:   05/20/13 1437  BP: 125/75  Pulse: 77  Height: 5\' 5"  (1.651 m)  Weight: 163 lb 8 oz (74.163 kg)  SpO2:  100%   Body mass index is 27.21 kg/(m^2). GENERAL: The patient is a well-nourished female, in no acute distress. The vital signs are documented above. CARDIOVASCULAR: There is a regular rate and rhythm. I do not detect carotid bruits. She has palpable pedal pulses bilaterally. She has mild bilateral lower extremity swelling. PULMONARY: There is good air exchange bilaterally without wheezing or rales. ABDOMEN: Soft and non-tender with normal pitched bowel sounds.  MUSCULOSKELETAL: There are no major deformities or cyanosis. NEUROLOGIC: No focal weakness or paresthesias are detected. SKIN: she has some small spider veins and telangiectasias bilaterally. She has a cluster of varicose veins along the medial aspect of her left calf. PSYCHIATRIC: The patient has a normal affect.  DATA:  I have independently interpreted her venous duplex scan. On the right side, she has noticed a DVT. She does have some deep vein reflux on the right. She  has some incompetence of the right greater saphenous vein and saphenofemoral junction.  On the left side there is no evidence of DVT. She does have some deep vein reflux on the left. She does have some incompetence of the greater saphenous vein on the left.  MEDICAL ISSUES:  Varicose veins of lower extremities with other complications This patient has symptomatic varicose veins bilaterally with significant chronic venous insufficiency. We have discussed the importance of intermittent leg elevation and the proper positioning for this. In addition I have written her a prescription for a thigh high compression stockings with a gradient of 20-30 mm mercury. I've encouraged her to avoid prolonged sitting and standing. I have encouraged her to walk as much as possible. I've explained and also water aerobics can sometimes be helpful for patients with chronic venous insufficiency. We will have her follow up in 3 months. If she is continuing to have symptoms despite her thigh high compression stockings she could potentially be considered for laser ablation of her greater saphenous vein on the left. His is the more symptomatic side.   [place] [facility] [place]: [phone]

## 2013-10-20 ENCOUNTER — Other Ambulatory Visit: Payer: Self-pay | Admitting: *Deleted

## 2013-10-20 DIAGNOSIS — I83893 Varicose veins of bilateral lower extremities with other complications: Secondary | ICD-10-CM

## 2013-10-20 DIAGNOSIS — M25562 Pain in left knee: Secondary | ICD-10-CM

## 2013-11-06 ENCOUNTER — Encounter: Payer: Self-pay | Admitting: Vascular Surgery

## 2013-11-10 ENCOUNTER — Ambulatory Visit (INDEPENDENT_AMBULATORY_CARE_PROVIDER_SITE_OTHER): Payer: BC Managed Care – PPO | Admitting: Vascular Surgery

## 2013-11-10 ENCOUNTER — Encounter: Payer: Self-pay | Admitting: Vascular Surgery

## 2013-11-10 ENCOUNTER — Other Ambulatory Visit: Payer: Self-pay | Admitting: Vascular Surgery

## 2013-11-10 ENCOUNTER — Ambulatory Visit (HOSPITAL_COMMUNITY)
Admission: RE | Admit: 2013-11-10 | Discharge: 2013-11-10 | Disposition: A | Discharge status: Home / Self Care | Payer: BC Managed Care – PPO | Source: Ambulatory Visit | Attending: Vascular Surgery | Admitting: Vascular Surgery

## 2013-11-10 VITALS — BP 128/86 | HR 80 | Resp 18 | Ht 64.0 in | Wt 165.3 lb

## 2013-11-10 DIAGNOSIS — M79609 Pain in unspecified limb: Secondary | ICD-10-CM | POA: Diagnosis not present

## 2013-11-10 DIAGNOSIS — I83893 Varicose veins of bilateral lower extremities with other complications: Secondary | ICD-10-CM

## 2013-11-10 DIAGNOSIS — M79605 Pain in left leg: Secondary | ICD-10-CM

## 2013-11-10 NOTE — Progress Notes (Signed)
Problems with Activities of Daily Living Secondary to Leg Pain  1. Holly Golden works in [employer] office and she works 8 hour days with prolonged standing.  This is difficult for her due to leg pain and swelling.    2. Holly Golden states that all activities require prolonged standing (cooking, cleaning, shopping) are difficult due to leg pain and swelling.       Failure of  Conservative Therapy:  1. Worn 20-30 mm Hg thigh high compression hose >3 months with no relief of symptoms.  2. Frequently elevates legs-no relief of symptoms  3. Taken Ibuprofen 600 Mg TID with no relief of symptoms.  Here today for continued discussion regarding her bilateral lower from the venous hypertension. She had been seen by Dr. Doren Custard. She had a formal venous duplex that time. She reports continued achy sensation in her calves bilaterally. She does have discomfort specifically over varicosities in her left medial calf.  I reexamined her veins with SonoSite ultrasound. Her great saphenous veins are relatively small on both sides. There is slight reflux documented. She does have significantly enlarged small saphenous veins bilaterally with reflux specifically into the varicosities in her left calf.  Impression and plan failed conservative treatment of small saphenous vein reflux. Have recommended staged bilateral laser ablation of her small saphenous vein for improvement in her venous hypertension. Explained the procedure and the slight risk of DVT associated with this. She understands and wished to proceed as soon as possible

## 2013-11-12 ENCOUNTER — Other Ambulatory Visit: Payer: Self-pay | Admitting: *Deleted

## 2013-11-12 DIAGNOSIS — I83893 Varicose veins of bilateral lower extremities with other complications: Secondary | ICD-10-CM

## 2013-11-26 ENCOUNTER — Ambulatory Visit (INDEPENDENT_AMBULATORY_CARE_PROVIDER_SITE_OTHER): Payer: BC Managed Care – PPO | Admitting: Internal Medicine

## 2013-11-26 ENCOUNTER — Encounter (INDEPENDENT_AMBULATORY_CARE_PROVIDER_SITE_OTHER): Payer: Self-pay | Admitting: Internal Medicine

## 2013-11-26 VITALS — BP 92/42 | HR 76 | Temp 97.8°F | Ht 64.0 in | Wt 166.4 lb

## 2013-11-26 DIAGNOSIS — K625 Hemorrhage of anus and rectum: Secondary | ICD-10-CM | POA: Insufficient documentation

## 2013-11-26 NOTE — Progress Notes (Signed)
   Subjective:    Patient ID: Holly Golden, female    DOB: Jul 11, 1970, 43 y.o.   MRN: [id number]  HPI  Patient presents today with c/o rectal bleeding intermittently. Last seen in 2013. She presents today with c/o rectal bleeding. She said this is occuring several times a month. She says sometimes it is like "a period from her rectum". Occurs 5-8 times a month. She says not associated with constipation. She says she see blood mixed in with the stool. Blood is always bright red. Appetite has been good. No weight loss. Occasionally epigastric tenderness. No lower abdominal pain. Stools are normal size.  Usually has one stool a day.  Family hx of colon cancer in a 1st cousin who was diagnosed at age 31 and died at age 55 and a grandomother who was diagnosed at age 22.    08/06/2011 Colonoscopy: rectal bleeding Dr.Rehman:  Impression:  Normal terminal ileum.  Two small polyps ablated via cold biopsy and submitted together(sigmoid colon and rectosigmoid junction).  External hemorrhoids felt to be source of hematochezia.   Review of Systems  Past Medical History  Diagnosis Date  . Seasonal allergies   . UTI (urinary tract infection)   . Kidney stones   . 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 Hemorrhoids   . Breast nodule 08/15/2012    Left breast nodule at 2 oclock will get mammogram and [place]  . Swelling of hand 11/17/2012    Has bilateral swelling in hands, and they are cold    Past Surgical History  Procedure Laterality Date  . Cesarean section      1991  . Left tube and ovary removed for a benign mass.    . Cholecystectomy      2010  . Splenic hemangiomas    . Colonoscopy  08/06/2011    Procedure: COLONOSCOPY;  Surgeon: Rogene Houston, MD;  Location: [facility];  Service: Endoscopy;  Laterality: N/A;  730    No Known Allergies  Current Outpatient Prescriptions on File Prior to Visit  Medication Sig Dispense Refill  . cetirizine (ZYRTEC) 10 MG tablet Take 10 mg by  mouth daily.      Marland Kitchen ibuprofen (ADVIL,MOTRIN) 200 MG tablet Take 600 mg by mouth every 6 (six) hours as needed.       No current facility-administered medications on file prior to visit.          Objective:   Physical Exam  Filed Vitals:   11/26/13 1403  BP: 92/42  Pulse: 76  Temp: 97.8 F (36.6 C)  Height: 5\' 4"  (1.626 m)  Weight: 166 lb 6.4 oz (75.479 kg)   Alert and oriented. Skin warm and dry. Oral mucosa is moist.   . Sclera anicteric, conjunctivae is pink. Thyroid not enlarged. No cervical lymphadenopathy. Lungs clear. Heart regular rate and rhythm.  Abdomen is soft. Bowel sounds are positive. No hepatomegaly. No abdominal masses felt. No tenderness.  No edema to lower extremities. Stool brown and guaiac negative.        Assessment & Plan:  Rectal bleeding, probably hemorrhoidal.  I discussed with Dr. Laural Golden. CBC today. Next episode of bleeding, call our office and will schedule sigmoidoscopy that day with sedation.

## 2013-11-26 NOTE — Patient Instructions (Signed)
CBC today. Next episode of rectal bleeding, call our office the day you are bleeding and will set up sigmoidoscopy with sedation that day.

## 2013-11-27 LAB — CBC
HCT: 38.2 % (ref 36.0–46.0)
Hemoglobin: 13 g/dL (ref 12.0–15.0)
MCH: 29 pg (ref 26.0–34.0)
MCHC: 34 g/dL (ref 30.0–36.0)
MCV: 85.1 fL (ref 78.0–100.0)
PLATELETS: 179 10*3/uL (ref 150–400)
RBC: 4.49 MIL/uL (ref 3.87–5.11)
RDW: 14.3 % (ref 11.5–15.5)
WBC: 4.5 10*3/uL (ref 4.0–10.5)

## 2013-12-09 ENCOUNTER — Encounter: Payer: Self-pay | Admitting: Vascular Surgery

## 2013-12-10 ENCOUNTER — Telehealth (INDEPENDENT_AMBULATORY_CARE_PROVIDER_SITE_OTHER): Payer: Self-pay | Admitting: *Deleted

## 2013-12-10 ENCOUNTER — Ambulatory Visit (INDEPENDENT_AMBULATORY_CARE_PROVIDER_SITE_OTHER): Payer: BC Managed Care – PPO | Admitting: Vascular Surgery

## 2013-12-10 ENCOUNTER — Encounter: Payer: Self-pay | Admitting: Vascular Surgery

## 2013-12-10 VITALS — BP 139/84 | HR 81 | Resp 16 | Ht 64.0 in | Wt 165.0 lb

## 2013-12-10 DIAGNOSIS — I83899 Varicose veins of unspecified lower extremities with other complications: Secondary | ICD-10-CM | POA: Insufficient documentation

## 2013-12-10 DIAGNOSIS — I83892 Varicose veins of left lower extremities with other complications: Secondary | ICD-10-CM

## 2013-12-10 HISTORY — PX: ENDOVENOUS ABLATION SAPHENOUS VEIN W/ LASER: SUR449

## 2013-12-10 NOTE — Telephone Encounter (Signed)
I talked with patient. She says she had another episode of rectal bleeding in the comode and in her stool. I talked with you when she came in. You said next episode you would do a flexible sigmoid with sedation. Thought I let you decide what you wanted to do. She just had an ablation on her left left for varices today and now taking Motrin TID. I told her to half the dose. (Taking for inflammation, more than pain).

## 2013-12-10 NOTE — Telephone Encounter (Signed)
She needs to keep a record of bleeding episodes. Will delay  flexible sigmoidoscopy until has recovered from surgery

## 2013-12-10 NOTE — Progress Notes (Signed)
   Laser Ablation Procedure      Date: 12/10/2013    Holly Golden DOB:09/17/1970  Consent signed: Yes  Surgeon:T.F. Jurline Folger  Procedure: Laser Ablation: left Small Saphenous Vein  BP 139/84  Pulse 81  Resp 16  Ht 5\' 4"  (1.626 m)  Wt 165 lb (74.844 kg)  BMI 28.31 kg/m2  Start time: 1030   End time: 1125  Tumescent Anesthesia: 400 cc 0.9% NaCl with 50 cc Lidocaine HCL with 1% Epi and 15 cc 8.4% NaHCO3  Local Anesthesia: 2 cc Lidocaine HCL and NaHCO3 (ratio 2:1)  Pulsed mode:15 watts, 518ms delay, 1.0 duration and Total energy: 1390, Total pulses: 1, Total time: 1:32     Stab Phlebectomy: <10  Sites: Calf  Patient tolerated procedure well: Yes  Notes: Small saphenous treated from mid calf to just below popliteal junction  Description of Procedure:  After marking the course of the secondary varicosities, the patient was placed on the operating table in the prone position, and the left leg was prepped and draped in sterile fashion.   Local anesthetic was administered and under ultrasound guidance the saphenous vein was accessed with a micro needle and guide wire; then the mirco puncture sheath was place.  A guide wire was inserted saphenopopliteal junction , followed by a 5 french sheath.  The position of the sheath and then the laser fiber below the junction was confirmed using the ultrasound.  Tumescent anesthesia was administered along the course of the saphenous vein using ultrasound guidance. The patient was placed in Trendelenburg position and protective laser glasses were placed on patient and staff, and the laser was fired at at 15 watt continuous mode for a total of 1390 joules.   For stab phlebectomies, local anesthetic was administered at the previously marked varicosities, and tumescent anesthesia was administered around the vessels.  Seven stab wounds were made using the tip of an 11 blade. And using the vein hook, the phlebectomies were performed using a hemostat to avulse  the varicosities.  Adequate hemostasis was achieved.     Steri strips were applied to the stab wounds and ABD pads and thigh high compression stockings were applied.  Ace wrap bandages were applied over the phlebectomy sites and at the top of the saphenopopliteal junction. Blood loss was less than 15 cc.  The patient ambulated out of the operating room having tolerated the procedure well.

## 2013-12-10 NOTE — Telephone Encounter (Signed)
Holly Golden had another episode of rectal bleeding today. She was told to call Terri back when this happened and a sigmoidoscopy would be scheduled. Unfortunly she had a procedure on you left leg and was put on ideprofen 1800 mg daily for one week. The procedure on her right leg will be 12/31/13. Please advise if the sigmoidoscopy should be scheduled for when the leg surgery is over. The return phone [phone].

## 2013-12-11 NOTE — Telephone Encounter (Signed)
Patient was called ,cell phone. A message was left on her voice mail with Dr.Rehman's recommendation.

## 2013-12-15 ENCOUNTER — Telehealth: Payer: Self-pay | Admitting: *Deleted

## 2013-12-15 NOTE — Telephone Encounter (Signed)
    12/15/2013  Time: 9:00 AM   Patient Name: Holly Golden  Patient of: T.F. Early  Procedure:Laser Ablation and stab phlebectomy <10 incisions left  Greater saphenous vein/left leg 12-10-2013  Reached patient at home and checked  Her status  Yes    Comments/Actions Taken: [name] states no problems with left leg pain, swelling, bleeding,or oozing.  Reviewed post procedural instructions with [name] and reminded her of post lase ablation duplex and VV FU with Dr. Donnetta Hutching on 12-17-2013.       @SIGNATURE @

## 2013-12-16 ENCOUNTER — Encounter: Payer: Self-pay | Admitting: Vascular Surgery

## 2013-12-17 ENCOUNTER — Encounter: Payer: Self-pay | Admitting: Vascular Surgery

## 2013-12-17 ENCOUNTER — Ambulatory Visit (HOSPITAL_COMMUNITY)
Admission: RE | Admit: 2013-12-17 | Discharge: 2013-12-17 | Disposition: A | Discharge status: Home / Self Care | Payer: BC Managed Care – PPO | Source: Ambulatory Visit | Attending: Vascular Surgery | Admitting: Vascular Surgery

## 2013-12-17 ENCOUNTER — Ambulatory Visit (INDEPENDENT_AMBULATORY_CARE_PROVIDER_SITE_OTHER): Payer: BC Managed Care – PPO | Admitting: Vascular Surgery

## 2013-12-17 VITALS — BP 126/85 | HR 72 | Resp 16 | Ht 64.0 in | Wt 165.0 lb

## 2013-12-17 DIAGNOSIS — I8393 Asymptomatic varicose veins of bilateral lower extremities: Secondary | ICD-10-CM | POA: Insufficient documentation

## 2013-12-17 DIAGNOSIS — I83892 Varicose veins of left lower extremities with other complications: Secondary | ICD-10-CM

## 2013-12-17 NOTE — Progress Notes (Signed)
Here today for followup of her left small saphenous vein ablation N. phlebectomy of several tributary varicosities in her medial calf.  She reports that she has had minimal discomfort following this and has had minimal bruising as well. She has been compliant with her compression  Physical exam she does have minimal bruising present. No evidence of skin irritation.  Venous duplex today reveals closure of her small saphenous vein from the mid to distal calf to the knee. No evidence of DVT  Impression and plan stable status post laser ablation and phlebectomy of several tributary varicosities left leg. We'll continue her compression for one additional week. We will see her on 12/31/2013 for right leg laser ablation of her small saphenous vein

## 2013-12-30 ENCOUNTER — Encounter: Payer: Self-pay | Admitting: Vascular Surgery

## 2013-12-31 ENCOUNTER — Ambulatory Visit (INDEPENDENT_AMBULATORY_CARE_PROVIDER_SITE_OTHER): Payer: BC Managed Care – PPO | Admitting: Vascular Surgery

## 2013-12-31 ENCOUNTER — Encounter: Payer: Self-pay | Admitting: Vascular Surgery

## 2013-12-31 VITALS — BP 128/81 | HR 80 | Resp 16 | Ht 64.0 in | Wt 164.0 lb

## 2013-12-31 DIAGNOSIS — I83891 Varicose veins of right lower extremities with other complications: Secondary | ICD-10-CM

## 2013-12-31 HISTORY — PX: ENDOVENOUS ABLATION SAPHENOUS VEIN W/ LASER: SUR449

## 2013-12-31 NOTE — Progress Notes (Signed)
   Laser Ablation Procedure      Date: 12/31/2013    Holly Golden DOB:27-Nov-1970  Consent signed: Yes  Surgeon:T.F. Ling Flesch  Procedure: Laser Ablation: right Small Saphenous Vein  BP 128/81  Pulse 80  Resp 16  Ht 5\' 4"  (1.626 m)  Wt 164 lb (74.39 kg)  BMI 28.14 kg/m2  Start time: 855   End time: 920  Tumescent Anesthesia: 400 cc 0.9% NaCl with 50 cc Lidocaine HCL with 1% Epi and 15 cc 8.4% NaHCO3  Local Anesthesia: 1 cc Lidocaine HCL and NaHCO3 (ratio 2:1)  Pulsed mode:15 watts, 562ms delay, 1.0 duration and Total energy: 1561, Total pulses: 1, Total time: 1:43     Patient tolerated procedure well: Yes  Notes: I did image her left leg with SonoSite. She had ablation of her left small saphenous vein several weeks ago. Continues to have excellent result with closure of her left small saphenous and no evidence of thrombus in the popliteal vein.  Description of Procedure:  After marking the course of the secondary varicosities, the patient was placed on the operating table in the prone position, and the right leg was prepped and draped in sterile fashion.   Local anesthetic was administered and under ultrasound guidance the saphenous vein was accessed with a micro needle and guide wire; then the mirco puncture sheath was place.  A guide wire was inserted saphenopopliteal junction , followed by a 5 french sheath.  The position of the sheath and then the laser fiber below the junction was confirmed using the ultrasound.  Tumescent anesthesia was administered along the course of the saphenous vein using ultrasound guidance. The patient was placed in Trendelenburg position and protective laser glasses were placed on patient and staff, and the laser was fired at at 15 watt continuous mode for a total of 1561 joules.     Steri strips were applied to the stab wounds and ABD pads and thigh high compression stockings were applied.  Ace wrap bandages were applied over the phlebectomy sites and  at the top of the saphenopopliteal junction. Blood loss was less than 15 cc.  The patient ambulated out of the operating room having tolerated the procedure well.

## 2014-01-04 ENCOUNTER — Encounter: Payer: Self-pay | Admitting: Vascular Surgery

## 2014-01-05 ENCOUNTER — Telehealth: Payer: Self-pay | Admitting: *Deleted

## 2014-01-05 NOTE — Telephone Encounter (Signed)
    01/05/2014  Time: 9:02 AM   Patient Name: Holly Golden  Patient of: T.F. Early  Procedure:Laser Ablation right small saphenous vein 12-31-2013  Reached patient at home and checked  Her status  Yes    Comments/Actions Taken: Mrs. [name] states no problems with right leg pain or swelling.  Reviewed post procedural instructions with her and reminded her of post laser ablation duplex and VV FU with Dr. Donnetta Hutching on 01-07-2014.      @SIGNATURE @

## 2014-01-06 ENCOUNTER — Encounter: Payer: Self-pay | Admitting: Vascular Surgery

## 2014-01-07 ENCOUNTER — Other Ambulatory Visit: Payer: Self-pay | Admitting: Vascular Surgery

## 2014-01-07 ENCOUNTER — Ambulatory Visit (INDEPENDENT_AMBULATORY_CARE_PROVIDER_SITE_OTHER): Payer: BC Managed Care – PPO | Admitting: Vascular Surgery

## 2014-01-07 ENCOUNTER — Ambulatory Visit (HOSPITAL_COMMUNITY)
Admission: RE | Admit: 2014-01-07 | Discharge: 2014-01-07 | Disposition: A | Discharge status: Home / Self Care | Payer: BC Managed Care – PPO | Source: Ambulatory Visit | Attending: Vascular Surgery | Admitting: Vascular Surgery

## 2014-01-07 ENCOUNTER — Encounter: Payer: Self-pay | Admitting: Vascular Surgery

## 2014-01-07 VITALS — BP 121/84 | HR 79 | Resp 16 | Ht 64.0 in | Wt 164.0 lb

## 2014-01-07 DIAGNOSIS — I83891 Varicose veins of right lower extremities with other complications: Secondary | ICD-10-CM | POA: Insufficient documentation

## 2014-01-07 DIAGNOSIS — I8393 Asymptomatic varicose veins of bilateral lower extremities: Secondary | ICD-10-CM

## 2014-01-07 DIAGNOSIS — I83813 Varicose veins of bilateral lower extremities with pain: Secondary | ICD-10-CM

## 2014-01-07 NOTE — Progress Notes (Signed)
Here today for follow-up of laser ablation of right small saphenous vein on 12/31/2013. She is doing quite well. She has been compliant with her compression garment and has the typical amount discomfort over the ablation site. She has had minimal bruising.  On physical exam her right and left legs are quite good phlebectomy sites from the left lateral healing quite nicely and she is completely resolved any soreness on her left leg. She does have the typical amount of tenderness over the right leg with minimal bruising.  Duplex today reveals closure of her right small saphenous vein with no evidence of DVT.  Impression and plan stable follow-up after staged bilateral small saphenous vein ablation. She will continue her compression for one additional week and then when necessary. She will see [place] again on as-needed basis

## 2014-06-30 ENCOUNTER — Other Ambulatory Visit: Payer: Self-pay | Admitting: Adult Health

## 2014-06-30 DIAGNOSIS — Z1231 Encounter for screening mammogram for malignant neoplasm of breast: Secondary | ICD-10-CM

## 2014-07-12 ENCOUNTER — Other Ambulatory Visit: Payer: Self-pay | Admitting: *Deleted

## 2014-07-12 DIAGNOSIS — Z1329 Encounter for screening for other suspected endocrine disorder: Secondary | ICD-10-CM

## 2014-07-12 DIAGNOSIS — Z01419 Encounter for gynecological examination (general) (routine) without abnormal findings: Secondary | ICD-10-CM

## 2014-07-12 DIAGNOSIS — Z8639 Personal history of other endocrine, nutritional and metabolic disease: Secondary | ICD-10-CM

## 2014-07-13 LAB — COMPREHENSIVE METABOLIC PANEL
ALT: 11 IU/L (ref 0–32)
AST: 19 IU/L (ref 0–40)
Albumin/Globulin Ratio: 1.8 (ref 1.1–2.5)
Albumin: 4.4 g/dL (ref 3.5–5.5)
Alkaline Phosphatase: 67 IU/L (ref 39–117)
BILIRUBIN TOTAL: 0.6 mg/dL (ref 0.0–1.2)
BUN/Creatinine Ratio: 16 (ref 9–23)
BUN: 12 mg/dL (ref 6–24)
CALCIUM: 9.4 mg/dL (ref 8.7–10.2)
CHLORIDE: 103 mmol/L (ref 97–108)
CO2: 25 mmol/L (ref 18–29)
Creatinine, Ser: 0.77 mg/dL (ref 0.57–1.00)
GFR calc non Af Amer: 95 mL/min/{1.73_m2} (ref >59)
GFR, EST AFRICAN AMERICAN: 109 mL/min/{1.73_m2} (ref >59)
GLUCOSE: 86 mg/dL (ref 65–99)
Globulin, Total: 2.5 g/dL (ref 1.5–4.5)
POTASSIUM: 4.8 mmol/L (ref 3.5–5.2)
Sodium: 141 mmol/L (ref 134–144)
TOTAL PROTEIN: 6.9 g/dL (ref 6.0–8.5)

## 2014-07-13 LAB — LIPID PANEL
CHOLESTEROL TOTAL: 160 mg/dL (ref 100–199)
Chol/HDL Ratio: 5.9 ratio units — ABNORMAL HIGH (ref 0.0–4.4)
HDL: 27 mg/dL — ABNORMAL LOW (ref >39)
LDL Calculated: 83 mg/dL (ref 0–99)
Triglycerides: 252 mg/dL — ABNORMAL HIGH (ref 0–149)
VLDL Cholesterol Cal: 50 mg/dL — ABNORMAL HIGH (ref 5–40)

## 2014-07-13 LAB — CBC
HEMATOCRIT: 41.6 % (ref 34.0–46.6)
Hemoglobin: 13.7 g/dL (ref 11.1–15.9)
MCH: 28.4 pg (ref 26.6–33.0)
MCHC: 32.9 g/dL (ref 31.5–35.7)
MCV: 86 fL (ref 79–97)
PLATELETS: 183 10*3/uL (ref 150–379)
RBC: 4.83 x10E6/uL (ref 3.77–5.28)
RDW: 13.9 % (ref 12.3–15.4)
WBC: 3.2 10*3/uL — ABNORMAL LOW (ref 3.4–10.8)

## 2014-07-13 LAB — TSH: TSH: 2.92 u[IU]/mL (ref 0.450–4.500)

## 2014-07-13 LAB — FOLLICLE STIMULATING HORMONE: FSH: 5.3 m[IU]/mL

## 2014-07-14 ENCOUNTER — Encounter: Payer: Self-pay | Admitting: Adult Health

## 2014-07-14 ENCOUNTER — Ambulatory Visit (INDEPENDENT_AMBULATORY_CARE_PROVIDER_SITE_OTHER): Payer: BLUE CROSS/BLUE SHIELD | Admitting: Adult Health

## 2014-07-14 ENCOUNTER — Other Ambulatory Visit: Payer: Self-pay

## 2014-07-14 ENCOUNTER — Other Ambulatory Visit (HOSPITAL_COMMUNITY)
Admission: RE | Admit: 2014-07-14 | Discharge: 2014-07-14 | Disposition: A | Discharge status: Home / Self Care | Payer: BLUE CROSS/BLUE SHIELD | Source: Ambulatory Visit | Attending: Adult Health | Admitting: Adult Health

## 2014-07-14 VITALS — BP 112/72 | HR 72 | Ht 63.5 in | Wt 169.5 lb

## 2014-07-14 DIAGNOSIS — Z01419 Encounter for gynecological examination (general) (routine) without abnormal findings: Secondary | ICD-10-CM | POA: Diagnosis not present

## 2014-07-14 DIAGNOSIS — D739 Disease of spleen, unspecified: Secondary | ICD-10-CM

## 2014-07-14 DIAGNOSIS — Z1151 Encounter for screening for human papillomavirus (HPV): Secondary | ICD-10-CM | POA: Insufficient documentation

## 2014-07-14 DIAGNOSIS — E782 Mixed hyperlipidemia: Secondary | ICD-10-CM

## 2014-07-14 DIAGNOSIS — F439 Reaction to severe stress, unspecified: Secondary | ICD-10-CM

## 2014-07-14 DIAGNOSIS — Z1212 Encounter for screening for malignant neoplasm of rectum: Secondary | ICD-10-CM | POA: Diagnosis not present

## 2014-07-14 DIAGNOSIS — L659 Nonscarring hair loss, unspecified: Secondary | ICD-10-CM | POA: Insufficient documentation

## 2014-07-14 HISTORY — DX: Mixed hyperlipidemia: E78.2

## 2014-07-14 HISTORY — DX: Reaction to severe stress, unspecified: F43.9

## 2014-07-14 LAB — HEMOCCULT GUIAC POC 1CARD (OFFICE): Fecal Occult Blood, POC: NEGATIVE

## 2014-07-14 MED ORDER — ESCITALOPRAM OXALATE 10 MG PO TABS: 10.0000 mg | ORAL_TABLET | Freq: Every day | ORAL | Status: DC

## 2014-07-14 MED ORDER — FENOFIBRATE 54 MG PO TABS: 54.0000 mg | ORAL_TABLET | Freq: Every day | ORAL | Status: DC

## 2014-07-14 NOTE — Progress Notes (Signed)
Patient ID: Holly Golden, female   DOB: 10/28/1970, 44 y.o.   MRN: [id number] History of Present Illness: Holly Golden is a 44 year old white female, married, in for well woman gyn exam and pap.She had labs early in week to review today.She complains of hair loss and feeling stressed, has decreased libido and ?IBS, alternates constipation and diarrhea, has had bleeding in past and saw Holly Golden.Periods lite. Has discomfort in left side, has history of hemangiomas of spleen. Husband losing his job at [employer].  Current Medications, Allergies, Past Medical History, Past Surgical History, Family History and Social History were reviewed in [employer] record.     Review of Systems: Patient denies any headaches, hearing loss, fatigue, blurred vision, shortness of breath, chest pain,  problems with  Urination.  No joint pain or mood swings.See HPI for positives, has been to hair club and is on Rogaine, but she thinks its worse, she seems them tomorrow.    Physical Exam:BP 112/72 mmHg  Pulse 72  Ht 5' 3.5" (1.613 m)  Wt 169 lb 8 oz (76.885 kg)  BMI 29.55 kg/m2  LMP 07/03/2014 General:  Well developed, well nourished, no acute distress Skin:  Warm and dry Neck:  Midline trachea, normal thyroid, good ROM, no lymphadenopathy, does have some thinning o hair on top Lungs; Clear to auscultation bilaterally Breast:  No dominant palpable mass, retraction, or nipple discharge Cardiovascular: Regular rate and rhythm Abdomen:  Soft, non tender, no hepatosplenomegaly, noted but has some discomfort with palpation Pelvic:  External genitalia is normal in appearance, no lesions.  The vagina is normal in appearance. Urethra has no lesions or masses. The cervix is bulbous, pap with HPV performed.  Uterus is felt to be normal size, shape, and contour.  No adnexal masses or tenderness noted.Bladder is non tender, no masses felt. Rectal: Good sphincter tone, no polyps,  Internal hemorrhoids felt.   Hemoccult negative. Extremities/musculoskeletal:  No swelling or varicosities noted,has had laser on veins, no clubbing or cyanosis Psych:  No mood changes, alert and cooperative,seems happy Reviewed labs with pt, WBC 32. And has elevated triglycerides, will recheck CBC with diff in 1 week and Rx tricor and recheck labs in 12 weeks, she has had low WBC in past and saw Holly Golden, will refer if still low.discussed talking with husband and will add lexarpo to see if helps.  Impression: Well woman gyn exam with pap Elevated triglycerides Hair loss Splenic disease Stress   Plan: Rx lexapro 10 mg #30 1 daily with 1 refill Talk with husband [employer] Physical in 1 year CBC with diff in 1 week Rx tri cor 54 mg #30 take 1 daily with refills Check lipids and CMP in 12  Weeks  Mammogram yearly  Colonoscopy next year

## 2014-07-14 NOTE — Patient Instructions (Addendum)
Physical in 1 year  Mammogram yearly Colonoscopy next year CBC with diff next week Check lipids in 12 weeks Stress and Stress Management Stress is a normal reaction to life events. It is what you feel when life demands more than you are used to or more than you can handle. Some stress can be useful. For example, the stress reaction can help you catch the last bus of the day, study for a test, or meet a deadline at work. But stress that occurs too often or for too Dave can cause problems. It can affect your emotional health and interfere with relationships and normal daily activities. Too much stress can weaken your immune system and increase your risk for physical illness. If you already have a medical problem, stress can make it worse. CAUSES  All sorts of life events may cause stress. An event that causes stress for one person may not be stressful for another person. Major life events commonly cause stress. These may be positive or negative. Examples include losing your job, moving into a new home, getting married, having a baby, or losing a loved one. Less obvious life events may also cause stress, especially if they occur day after day or in combination. Examples include working [employer] hours, driving in traffic, caring for children, being in debt, or being in a difficult relationship. SIGNS AND SYMPTOMS Stress may cause emotional symptoms including, the following:  Anxiety. This is feeling worried, afraid, on edge, overwhelmed, or out of control.  Anger. This is feeling irritated or impatient.  Depression. This is feeling sad, down, helpless, or guilty.  Difficulty focusing, remembering, or making decisions. Stress may cause physical symptoms, including the following:   Aches and pains. These may affect your head, neck, back, stomach, or other areas of your body.  Tight muscles or clenched jaw.  Low energy or trouble sleeping. Stress may cause unhealthy behaviors, including the  following:   Eating to feel better (overeating) or skipping meals.  Sleeping too little, too much, or both.  Working too much or putting off tasks (procrastination).  Smoking, drinking alcohol, or using drugs to feel better. DIAGNOSIS  Stress is diagnosed through an assessment by your health care provider. Your health care provider will ask questions about your symptoms and any stressful life events.Your health care provider will also ask about your medical history and may order blood tests or other tests. Certain medical conditions and medicine can cause physical symptoms similar to stress. Mental illness can cause emotional symptoms and unhealthy behaviors similar to stress. Your health care provider may refer you to a mental health professional for further evaluation.  TREATMENT  Stress management is the recommended treatment for stress.The goals of stress management are reducing stressful life events and coping with stress in healthy ways.  Techniques for reducing stressful life events include the following:  Stress identification. Self-monitor for stress and identify what causes stress for you. These skills may help you to avoid some stressful events.  Time management. Set your priorities, keep a calendar of events, and learn to say "no." These tools can help you avoid making too many commitments. Techniques for coping with stress include the following:  Rethinking the problem. Try to think realistically about stressful events rather than ignoring them or overreacting. Try to find the positives in a stressful situation rather than focusing on the negatives.  Exercise. Physical exercise can release both physical and emotional tension. The key is to find a form of exercise you enjoy and  do it regularly.  Relaxation techniques. These relax the body and mind. Examples include yoga, meditation, tai chi, biofeedback, deep breathing, progressive muscle relaxation, listening to music, being  out in nature, journaling, and other hobbies. Again, the key is to find one or more that you enjoy and can do regularly.  Healthy lifestyle. Eat a balanced diet, get plenty of sleep, and do not smoke. Avoid using alcohol or drugs to relax.  Strong support network. Spend time with family, friends, or other people you enjoy being around.Express your feelings and talk things over with someone you trust. Counseling or talktherapy with a mental health professional may be helpful if you are having difficulty managing stress on your own. Medicine is typically not recommended for the treatment of stress.Talk to your health care provider if you think you need medicine for symptoms of stress. HOME CARE INSTRUCTIONS  Keep all follow-up visits as directed by your health care provider.  Take all medicines as directed by your health care provider. SEEK MEDICAL CARE IF:  Your symptoms get worse or you start having new symptoms.  You feel overwhelmed by your problems and can no longer manage them on your own. SEEK IMMEDIATE MEDICAL CARE IF:  You feel like hurting yourself or someone else. Document Released: 08/15/2000 Document Revised: 07/06/2013 Document Reviewed: 10/14/2012 [facility] Patient Information 2015 [place], [place]. This information is not intended to replace advice given to you by your health care provider. Make sure you discuss any questions you have with your health care provider.

## 2014-07-15 LAB — CYTOLOGY - PAP

## 2014-07-19 ENCOUNTER — Ambulatory Visit (HOSPITAL_COMMUNITY)
Admission: RE | Admit: 2014-07-19 | Discharge: 2014-07-19 | Disposition: A | Discharge status: Home / Self Care | Payer: BLUE CROSS/BLUE SHIELD | Source: Ambulatory Visit | Attending: Adult Health | Admitting: Adult Health

## 2014-07-19 DIAGNOSIS — Z1231 Encounter for screening mammogram for malignant neoplasm of breast: Secondary | ICD-10-CM | POA: Diagnosis present

## 2014-07-23 ENCOUNTER — Other Ambulatory Visit: Payer: Self-pay | Admitting: Adult Health

## 2014-07-23 DIAGNOSIS — D72819 Decreased white blood cell count, unspecified: Secondary | ICD-10-CM

## 2014-07-24 LAB — CBC WITH DIFFERENTIAL/PLATELET
BASOS ABS: 0 10*3/uL (ref 0.0–0.2)
Basos: 0 %
EOS (ABSOLUTE): 0.1 10*3/uL (ref 0.0–0.4)
Eos: 1 %
HEMATOCRIT: 38.9 % (ref 34.0–46.6)
HEMOGLOBIN: 13.2 g/dL (ref 11.1–15.9)
IMMATURE GRANS (ABS): 0 10*3/uL (ref 0.0–0.1)
Immature Granulocytes: 0 %
LYMPHS ABS: 1.8 10*3/uL (ref 0.7–3.1)
LYMPHS: 37 %
MCH: 28.6 pg (ref 26.6–33.0)
MCHC: 33.9 g/dL (ref 31.5–35.7)
MCV: 84 fL (ref 79–97)
Monocytes Absolute: 0.3 10*3/uL (ref 0.1–0.9)
Monocytes: 7 %
Neutrophils Absolute: 2.6 10*3/uL (ref 1.4–7.0)
Neutrophils: 55 %
Platelets: 200 10*3/uL (ref 150–379)
RBC: 4.61 x10E6/uL (ref 3.77–5.28)
RDW: 14 % (ref 12.3–15.4)
WBC: 4.9 10*3/uL (ref 3.4–10.8)

## 2014-07-26 ENCOUNTER — Telehealth: Payer: Self-pay | Admitting: Adult Health

## 2014-07-26 NOTE — Telephone Encounter (Signed)
Left message labs normal

## 2014-08-26 ENCOUNTER — Telehealth: Payer: Self-pay | Admitting: *Deleted

## 2014-08-26 MED ORDER — FENOFIBRATE 54 MG PO TABS: 54.0000 mg | ORAL_TABLET | Freq: Every day | ORAL | Status: DC

## 2014-08-26 NOTE — Telephone Encounter (Signed)
Spoke with pt. Pt is requesting that we send a prescription of her gen Tricor to mail order pharmacy. 90 day supply with refills. Thanks!! [place]

## 2014-08-26 NOTE — Telephone Encounter (Signed)
Left message refilled tricor

## 2015-02-15 ENCOUNTER — Ambulatory Visit (INDEPENDENT_AMBULATORY_CARE_PROVIDER_SITE_OTHER): Payer: BLUE CROSS/BLUE SHIELD | Admitting: Adult Health

## 2015-02-15 ENCOUNTER — Encounter: Payer: Self-pay | Admitting: Adult Health

## 2015-02-15 VITALS — BP 122/80 | HR 74 | Ht 63.5 in | Wt 169.0 lb

## 2015-02-15 DIAGNOSIS — R1012 Left upper quadrant pain: Secondary | ICD-10-CM

## 2015-02-15 DIAGNOSIS — D739 Disease of spleen, unspecified: Secondary | ICD-10-CM

## 2015-02-15 DIAGNOSIS — K055 Other periodontal diseases: Secondary | ICD-10-CM | POA: Diagnosis not present

## 2015-02-15 DIAGNOSIS — K068 Other specified disorders of gingiva and edentulous alveolar ridge: Secondary | ICD-10-CM

## 2015-02-15 HISTORY — DX: Other specified disorders of gingiva and edentulous alveolar ridge: K06.8

## 2015-02-15 HISTORY — DX: Left upper quadrant pain: R10.12

## 2015-02-15 NOTE — Patient Instructions (Signed)
[place] 12/16 at 7:30 at [facility] Will talk when labs back

## 2015-02-15 NOTE — Progress Notes (Signed)
Subjective:     Patient ID: Holly Golden, female   DOB: [birth year], 44 y.o.   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  Review of Systems  Patient denies any headaches, hearing loss, fatigue, blurred vision, shortness of breath, chest pain, problems with bowel movements, urination, or intercourse. No joint pain or mood swings.See HPI for positives. Reviewed past medical,surgical, social and family history. Reviewed medications and allergies.     Objective:   Physical Exam BP 122/80 mmHg  Pulse 74  Ht 5' 3.5" (1.613 m)  Wt 169 lb (76.658 kg)  BMI 29.46 kg/m2  LMP 11/23/2016Skin warm and dry, abdomen soft, no masses but tender LUQ area, over spleen,gums not bleeding now but will if brushed.     Assessment:     LUQ pain Splenic disease  Bleeding gums     Plan:    Stop OTC vitamins  Check CBC with diff and ESR Abdominal US 12/16 at 7:30 am at [facility] Follow up prn, will talk when results back

## 2015-02-16 ENCOUNTER — Telehealth: Payer: Self-pay | Admitting: Adult Health

## 2015-02-16 LAB — CBC WITH DIFFERENTIAL/PLATELET
BASOS: 0 %
Basophils Absolute: 0 10*3/uL (ref 0.0–0.2)
EOS (ABSOLUTE): 0.1 10*3/uL (ref 0.0–0.4)
Eos: 1 %
HEMATOCRIT: 39.9 % (ref 34.0–46.6)
Hemoglobin: 13.6 g/dL (ref 11.1–15.9)
IMMATURE GRANS (ABS): 0 10*3/uL (ref 0.0–0.1)
Immature Granulocytes: 0 %
Lymphocytes Absolute: 1.6 10*3/uL (ref 0.7–3.1)
Lymphs: 41 %
MCH: 29 pg (ref 26.6–33.0)
MCHC: 34.1 g/dL (ref 31.5–35.7)
MCV: 85 fL (ref 79–97)
MONOS ABS: 0.3 10*3/uL (ref 0.1–0.9)
Monocytes: 6 %
NEUTROS ABS: 2.1 10*3/uL (ref 1.4–7.0)
Neutrophils: 52 %
Platelets: 199 10*3/uL (ref 150–379)
RBC: 4.69 x10E6/uL (ref 3.77–5.28)
RDW: 14 % (ref 12.3–15.4)
WBC: 4 10*3/uL (ref 3.4–10.8)

## 2015-02-16 LAB — SEDIMENTATION RATE: Sed Rate: 3 mm/hr (ref 0–32)

## 2015-02-16 NOTE — Telephone Encounter (Signed)
Pt aware labs normal  

## 2015-02-18 ENCOUNTER — Telehealth: Payer: Self-pay | Admitting: Adult Health

## 2015-02-18 ENCOUNTER — Ambulatory Visit (HOSPITAL_COMMUNITY)
Admission: RE | Admit: 2015-02-18 | Discharge: 2015-02-18 | Disposition: A | Discharge status: Home / Self Care | Payer: BLUE CROSS/BLUE SHIELD | Source: Ambulatory Visit | Attending: Adult Health | Admitting: Adult Health

## 2015-02-18 DIAGNOSIS — R161 Splenomegaly, not elsewhere classified: Secondary | ICD-10-CM | POA: Diagnosis not present

## 2015-02-18 DIAGNOSIS — D739 Disease of spleen, unspecified: Secondary | ICD-10-CM | POA: Diagnosis not present

## 2015-02-18 DIAGNOSIS — R1012 Left upper quadrant pain: Secondary | ICD-10-CM | POA: Insufficient documentation

## 2015-02-18 NOTE — Telephone Encounter (Signed)
Let message to call about US 

## 2015-02-18 NOTE — Telephone Encounter (Signed)
Pt aware spleen looks stable but if wants to see Dr Whitney Muse let me know

## 2015-03-23 ENCOUNTER — Encounter (HOSPITAL_COMMUNITY): Payer: BLUE CROSS/BLUE SHIELD | Attending: Hematology & Oncology | Admitting: Hematology & Oncology

## 2015-03-23 ENCOUNTER — Encounter (HOSPITAL_COMMUNITY): Payer: Self-pay | Admitting: Hematology & Oncology

## 2015-03-23 ENCOUNTER — Encounter (HOSPITAL_COMMUNITY): Payer: BLUE CROSS/BLUE SHIELD | Attending: Hematology & Oncology

## 2015-03-23 VITALS — BP 140/83 | HR 87 | Temp 97.9°F | Resp 18 | Ht 63.0 in | Wt 166.7 lb

## 2015-03-23 DIAGNOSIS — R161 Splenomegaly, not elsewhere classified: Secondary | ICD-10-CM

## 2015-03-23 DIAGNOSIS — D739 Disease of spleen, unspecified: Secondary | ICD-10-CM

## 2015-03-23 DIAGNOSIS — R1012 Left upper quadrant pain: Secondary | ICD-10-CM

## 2015-03-23 DIAGNOSIS — D1803 Hemangioma of intra-abdominal structures: Secondary | ICD-10-CM | POA: Insufficient documentation

## 2015-03-23 NOTE — Patient Instructions (Addendum)
[place] at [facility] Discharge Instructions  RECOMMENDATIONS MADE BY THE CONSULTANT AND ANY TEST RESULTS WILL BE SENT TO YOUR REFERRING PHYSICIAN.    Exam and discussion by Dr Whitney Muse today. Splenic Hemangiomas-benign. CT scan of abdomen/pelvis to get a better look at your spleen. We can get you a surgical consult if you want it. We will call you with the results of the CT scans, we will bring you in to discuss these results if we need too. If your spleen becomes to uncomfortable then you can look at having your spleen removed. Blood work today: flow cytometry Return to see the doctor in 6 months. Please call the clinic if you have any questions or concerns.     Thank you for choosing [place] at [facility] to provide your oncology and hematology care.  To afford each patient quality time with our provider, please arrive at least 15 minutes before your scheduled appointment time.   Beginning January 23rd 2017 lab work for the [facility] will be done in the  Main lab at [facility] on 1st floor. If you have a lab appointment with the [facility] please come in thru the  Main Entrance and check in at the main information desk  You need to re-schedule your appointment should you arrive 10 or more minutes late.  We strive to give you quality time with our providers, and arriving late affects you and other patients whose appointments are after yours.  Also, if you no show three or more times for appointments you may be dismissed from the clinic at the providers discretion.     Again, thank you for choosing [facility].  Our hope is that these requests will decrease the amount of time that you wait before being seen by our physicians.       _____________________________________________________________  Should you have questions after your visit to [facility], please contact our office at [phone]  between the hours of 8:30 a.m. and 4:30 p.m.  Voicemails left after 4:30 p.m. will not be returned until the following business day.  For prescription refill requests, have your pharmacy contact our office.

## 2015-03-23 NOTE — Progress Notes (Signed)
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 female is here because of increasing LUQ pain and a history of splenic hemangiomas.  She comes to the [facility] for consult alone today.  Mrs. Lubas comments that she's had hemangiomas for a Anthes time. 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 She remarks, "I was having pain where I'd break out in a sweat and double over." As a result of this, she was sent for an ultrasound. It was at that time that they discovered the hemangiomas. The pain, however, came from a benign pelvic mass, which was removed in 2005. At the same time, they removed her left tube and ovary, since the mass affected that side of her pelvis. Ever since then she says she has "dealt with it," in terms of her symptoms.  Regarding her recent recurrence of issues, she's noticed not pain, but discomfort. She notes a need for "constant repositioning." She says she can't lay on her left side comfortably. She went to [facility] and told her about it, and received an ultrasound in 2015, which was repeated. It is as a result of that repeat ultrasound that she comes to the clinic today.   She denies any weight loss or appetite loss, and denies any trouble eating. She confirms that she does tend to get full very quickly. She has had her gallbladder taken out and says now she experiences hyperactive bowel sounds and urgency.   She also mentions that she had an ablation done on the veins in her legs because she was having a lot of leg swelling. She says that it has helped, but that the discomfort is not completely gone. She says "I think it's helped the swelling, for sure."   She confirms that she gets regular  mammograms. She has also had a colonoscopy and says she will probably get another one done next year, with Dr. Laural Golden. She says she thinks he did remove a polyp last time.  She denies any problems breathing, any cardiac issues, and any visual problems. When asked to describe her discomfort on the left side, she says "it's not all the time, but after a meal, she notices it more." She says "driving, I notice I have to shift; it just depends on the way I sit."   She only used birth control pills for a short while in the past when they were trying to shrink her pelvic mass. She reports that there has been no type of extensive past hormonal birth control use, including oral contraceptives or depo provera.  She denies drenching night sweats, fever or chills. No weight loss.   MEDICAL HISTORY:  Past Medical History  Diagnosis Date  . Seasonal allergies   . UTI (urinary tract infection)   . Kidney stones   . 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 Hemorrhoids   . Breast nodule 08/15/2012    Left breast nodule at 2 oclock will get mammogram and [place]  . Swelling of hand 11/17/2012    Has bilateral swelling in hands, and they are cold  . Rectal bleeding   . Varicose veins   . Hair loss   . Raynaud's disease   . Elevated triglycerides  with high cholesterol 07/14/2014  . Stress 07/14/2014  . LUQ pain 02/15/2015  . Bleeding gums 02/15/2015    SURGICAL HISTORY: Past Surgical History  Procedure Laterality Date  . Cesarean section      1991  . Left tube and ovary removed for a benign mass.    . Cholecystectomy      2010  . Splenic hemangiomas    . Colonoscopy  08/06/2011    Procedure: COLONOSCOPY;  Surgeon: Rogene Houston, MD;  Location: [facility];  Service: Endoscopy;  Laterality: N/A;  730  . Endovenous ablation saphenous vein w/ laser Left 12-10-2013    EVLA  left small saphenous vein  and stab phlebectomy <10 Incision left leg  . Endovenous ablation saphenous vein w/ laser Right  12-31-2013    right small saphenous vein  by Curt Jews MD    SOCIAL HISTORY: Social History   Social History  . Marital Status: Married    Spouse Name: N/A  . Number of Children: N/A  . Years of Education: N/A   Occupational History  . Not on file.   Social History Main Topics  . Smoking status: Never Smoker   . Smokeless tobacco: Never Used  . Alcohol Use: No  . Drug Use: No  . Sexual Activity: Not Currently    Birth Control/ Protection: Surgical     Comment: husband had vasectomy   Other Topics Concern  . 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 Cancer Maternal Grandmother     pancreatic  . Cancer Paternal Grandmother     breast and colon  . Seizures Mother   . Seizures Brother   . Stroke Paternal Grandfather   . Cancer Cousin     colon, hodgkin's  . Diabetes Cousin   . Cirrhosis Maternal Grandfather   . Diabetes Other    indicated that her mother is alive. She indicated that her father is alive. She indicated that her brother is alive. She indicated that her maternal grandmother is deceased. She indicated that her maternal grandfather is deceased. She indicated that her paternal grandmother is deceased. She indicated that her paternal grandfather is deceased. She indicated that both of her sons are alive. She indicated that her other is alive.   Both of her parents are living Mother and father are both healthy Mother has seizures and cholesterol issues; other than that nothing major Father is 72, more there 74 Has one half-brother, as far as blood; he has seizures; he's her mother's son Her mother's seizures started in her teens; her brother's grand mal seizures started when he was  56 Mrs. Takahashi has never had seizures Several cousins with factor 5 blood clotting disorders Cousin with a history of hodgkins; she is 81 years old Lost a cousin to colon cancer at 63 years old Paternal grandmother had colon cancer and breast cancer Maternal grandmother had pancreatic cancer.  ALLERGIES:  has No Known Allergies.  MEDICATIONS:  Current Outpatient Prescriptions  Medication Sig Dispense Refill  . amoxicillin-clavulanate (AUGMENTIN) 875-125 MG tablet Take 1 tablet by mouth 2 (two) times daily.  0  . cetirizine (ZYRTEC) 10 MG tablet Take 10 mg  by mouth daily.    Marland Kitchen ibuprofen (ADVIL,MOTRIN) 200 MG tablet Take 600 mg by mouth every 6 (six) hours as needed.    . minoxidil (ROGAINE) 2 % external solution Apply topically 2 (two) times daily.     No current facility-administered medications for this visit.    Review of Systems  Constitutional: Negative.  Negative for fever, chills, weight loss and malaise/fatigue.  HENT: Negative.  Negative for congestion, hearing loss, nosebleeds, sore throat and tinnitus.   Eyes: Negative.  Negative for blurred vision, double vision, pain and discharge.  Respiratory: Negative.  Negative for cough, hemoptysis, sputum production, shortness of breath and wheezing.   Cardiovascular: Negative.  Negative for chest pain, palpitations, claudication, leg swelling and PND.  Gastrointestinal: Negative.  Negative for heartburn, nausea, vomiting, abdominal pain, diarrhea, constipation, blood in stool and melena.       Discomfort in the left side.  Genitourinary: Negative.  Negative for dysuria, urgency, frequency and hematuria.  Musculoskeletal: Negative.  Negative for myalgias, joint pain and falls.  Skin: Negative.  Negative for itching and rash.  Neurological: Negative.  Negative for dizziness, tingling, tremors, sensory change, speech change, focal weakness, seizures, loss of consciousness, weakness and headaches.  Endo/Heme/Allergies: Negative.  Does not  bruise/bleed easily.  Psychiatric/Behavioral: Negative.  Negative for depression, suicidal ideas, memory loss and substance abuse. The patient is not nervous/anxious and does not have insomnia.   All other systems reviewed and are negative.  14 point ROS was done and is otherwise as detailed above or in HPI   PHYSICAL EXAMINATION: ECOG PERFORMANCE STATUS: 1 - Symptomatic but completely ambulatory  Filed Vitals:   03/23/15 1412  BP: 140/83  Pulse: 87  Temp: 97.9 F (36.6 C)  Resp: 18   Filed Weights   03/23/15 1412  Weight: 166 lb 11.2 oz (75.615 kg)     Physical Exam  Constitutional: She is oriented to person, place, and time and well-developed, well-nourished, and in no distress.  HENT:  Head: Normocephalic and atraumatic.  Nose: Nose normal.  Mouth/Throat: Oropharynx is clear and moist. No oropharyngeal exudate.  Eyes: Conjunctivae and EOM are normal. Pupils are equal, round, and reactive to light. Right eye exhibits no discharge. Left eye exhibits no discharge. No scleral icterus.  Neck: Normal range of motion. Neck supple. No tracheal deviation present. No thyromegaly present.  Cardiovascular: Normal rate, regular rhythm and normal heart sounds.  Exam reveals no gallop and no friction rub.   No murmur heard. Pulmonary/Chest: Effort normal and breath sounds normal. She has no wheezes. She has no rales.  Abdominal: Soft. Bowel sounds are normal. She exhibits no distension and no mass. There is no tenderness. There is no rebound and no guarding.  Tender in the right mid-abdomen.  Musculoskeletal: Normal range of motion. She exhibits no edema.  Lymphadenopathy:    She has no cervical adenopathy.  Neurological: She is alert and oriented to person, place, and time. She has normal reflexes. No cranial nerve deficit. Gait normal. Coordination normal.  Skin: Skin is warm and dry. No rash noted.  Psychiatric: Mood, memory, affect and judgment normal.  Nursing note and vitals  reviewed.     LABORATORY DATA:  I have reviewed the data as listed Lab Results  Component Value Date   WBC 4.0 02/15/2015   HGB 13.0 11/26/2013   HCT 39.9 02/15/2015   MCV 85 02/15/2015   PLT 199 02/15/2015   CMP     Component Value Date/Time   NA  141 07/12/2014 0955   NA 137 11/17/2012 1639   K 4.8 07/12/2014 0955   CL 103 07/12/2014 0955   CO2 25 07/12/2014 0955   GLUCOSE 86 07/12/2014 0955   GLUCOSE 84 11/17/2012 1639   BUN 12 07/12/2014 0955   BUN 12 11/17/2012 1639   CREATININE 0.77 07/12/2014 0955   CREATININE 0.73 11/17/2012 1639   CALCIUM 9.4 07/12/2014 0955   PROT 6.9 07/12/2014 0955   PROT 7.3 11/17/2012 1639   ALBUMIN 4.4 07/12/2014 0955   ALBUMIN 4.5 11/17/2012 1639   AST 19 07/12/2014 0955   ALT 11 07/12/2014 0955   ALKPHOS 67 07/12/2014 0955   BILITOT 0.6 07/12/2014 0955   BILITOT 0.3 11/17/2012 1639   GFRNONAA 95 07/12/2014 0955   GFRAA 109 07/12/2014 0955    RADIOGRAPHIC STUDIES: I have personally reviewed the radiological images as listed and agreed with the findings in the report.  *RADIOLOGY REPORT*  Clinical Data: Left upper quadrant pain.  COMPLETE ABDOMINAL ULTRASOUND  Comparison: MRI 10/27/2003.  Findings:  Gallbladder: Prior cholecystectomy.  Common bile duct: Normal caliber, 5 mm.  Liver: No focal lesion identified. Within normal limits in parenchymal echogenicity.  IVC: Appears normal.  Pancreas: No focal abnormality seen.  Spleen: 11.4 cm in craniocaudal length. Heterogeneous echotexture throughout the spleen with multiple hyperechoic area is throughout the spleen, the largest measures up to 3.0 cm. These were present on prior MRI and felt represent multiple hemangiomas. Likely no change.  Right Kidney: Normal in size and parenchymal echogenicity. No evidence of mass or hydronephrosis.  Left Kidney: Normal in size and parenchymal echogenicity. No evidence of mass or  hydronephrosis.  Abdominal aorta: No aneurysm identified.  IMPRESSION: Multiple heterogeneous predominately hyperechoic lesions within the spleen as seen on prior MRI, likely hemangiomas. No real change.   Original Report Authenticated By: Raelyn Number, M.D.   CLINICAL DATA: 45 year old female with history of left upper quadrant abdominal pain for the past 2 months. Splenic evaluation.  EXAM: LIMITED ABDOMINAL ULTRASOUND  COMPARISON: Abdominal ultrasound 11/29/2011.  FINDINGS: Spleen measures up to 13.9 cm in length, with an estimated splenic volume of 560 for cm cubed. Within the spleen there are multiple echogenic areas with increased through transmission, largest of which measures 2.9 x 2.9 x 3.6 cm, favored to reflect multiple small splenic hemangiomas. These findings are similar to the prior study.  IMPRESSION: 1. Spleen is mildly enlarged, and there continues to be multiple small hyperechoic splenic lesions with imaging characteristics most compatible with splenic hemangiomas. These lesions are very similar to prior study 11/29/2011.   Electronically Signed  By: Vinnie Langton M.D.  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 I will keep her apprised of the results once available. We discussed that most likely based upon imaging that is available she has benign splenic hemangiomas. We discussed that we do not biopsy the spleen. We discussed that splenectomy is not a trivial procedure and we discussed the spleens role in immunity. We reviewed general indications for splenectomy including painfully enlarged slpeen, ITP, AIHA, traumatic spleen injury, hereditary spherocytosis etc.  I will send her blood out for flow cytometry today. CBC and ESR from 02/15/2015 were reviewed.   We will tentatively  plan on 6 month return pending the results of her CT imaging.   (Congenital causes for splenomegaly include splenic hemangioma,  hamartoma, or cysts.)   Orders Placed This Encounter  Procedures  . CT Abdomen Pelvis W Contrast    Standing Status: Future     Number of Occurrences:      Standing Expiration Date: 03/22/2016    Order Specific Question:  If indicated for the ordered procedure, I authorize the administration of contrast media per Radiology protocol    Answer:  Yes    Order Specific Question:  Reason for Exam (SYMPTOM  OR DIAGNOSIS REQUIRED)    Answer:  splenomegaly, LUQ pain    Order Specific Question:  Is the patient pregnant?    Answer:  No    Order Specific Question:  Preferred imaging location?    Answer:  [facility]    All questions were answered. The patient knows to call the clinic with any problems, questions or concerns.  This document serves as a record of services personally performed by Ancil Linsey, MD. It was created on her behalf by Toni Amend, a trained medical scribe. The creation of this record is based on the scribe's personal observations and the provider's statements to them. This document has been checked and approved by the attending provider.  I have reviewed the above documentation for accuracy and completeness, and I agree with the above.  This note was electronically signed.    Molli Hazard, MD  03/23/2015 3:49 PM

## 2015-03-23 NOTE — Progress Notes (Signed)
Shandrika Behrle's reason for visit today is for labs as scheduled per MD orders.  Venipuncture performed with a 23 gauge butterfly needle to R Antecubital.  Holly Golden tolerated procedure well and without incident; questions were answered and patient was discharged.

## 2015-03-25 ENCOUNTER — Ambulatory Visit (HOSPITAL_COMMUNITY)
Admission: RE | Admit: 2015-03-25 | Discharge: 2015-03-25 | Disposition: A | Discharge status: Home / Self Care | Payer: BLUE CROSS/BLUE SHIELD | Source: Ambulatory Visit | Attending: Hematology & Oncology | Admitting: Hematology & Oncology

## 2015-03-25 DIAGNOSIS — D1803 Hemangioma of intra-abdominal structures: Secondary | ICD-10-CM | POA: Insufficient documentation

## 2015-03-25 DIAGNOSIS — R161 Splenomegaly, not elsewhere classified: Secondary | ICD-10-CM | POA: Insufficient documentation

## 2015-03-25 DIAGNOSIS — R1012 Left upper quadrant pain: Secondary | ICD-10-CM | POA: Diagnosis present

## 2015-03-25 DIAGNOSIS — D739 Disease of spleen, unspecified: Secondary | ICD-10-CM

## 2015-03-25 MED ORDER — IOHEXOL 300 MG/ML  SOLN
100.0000 mL | Freq: Once | INTRAMUSCULAR | Status: AC | PRN
  Administered 2015-03-25: 100 mL via INTRAVENOUS

## 2015-03-29 ENCOUNTER — Telehealth (HOSPITAL_COMMUNITY): Payer: Self-pay | Admitting: Hematology & Oncology

## 2015-03-29 NOTE — Telephone Encounter (Signed)
Tried all three available numbers and patient not able to be contacted. She is not at work today.  Need to discuss results of recent CT and further recommendations. Will continue to try to contact. Dr. Whitney Muse

## 2015-04-01 ENCOUNTER — Telehealth (HOSPITAL_COMMUNITY): Payer: Self-pay | Admitting: Hematology & Oncology

## 2015-04-01 NOTE — Telephone Encounter (Signed)
Discussed CT results with patient.  Because she has increasing LUQ pain she would like surgical referral for discussion of splenectomy. She understands risks associated with spleen removal but would like surgical opinion. She is not necessarily opposed to ongoing observation.  Will work on referral. Advised would notify her next week. Donald Pore MD

## 2015-04-06 ENCOUNTER — Other Ambulatory Visit (HOSPITAL_COMMUNITY): Payer: Self-pay | Admitting: Hematology & Oncology

## 2015-04-06 ENCOUNTER — Encounter (HOSPITAL_COMMUNITY): Payer: Self-pay | Admitting: Lab

## 2015-04-06 NOTE — Progress Notes (Signed)
Referral sent to [place] / Dr Johney Maine.  Appt 2/17 @1 :15.  Patient aware of appt.

## 2015-04-22 ENCOUNTER — Other Ambulatory Visit: Payer: Self-pay | Admitting: Surgery

## 2015-09-16 ENCOUNTER — Ambulatory Visit (HOSPITAL_COMMUNITY): Payer: BLUE CROSS/BLUE SHIELD | Admitting: Hematology & Oncology

## 2015-09-17 NOTE — Progress Notes (Signed)
This encounter was created in error - please disregard.

## 2015-09-20 ENCOUNTER — Ambulatory Visit (HOSPITAL_COMMUNITY): Payer: BLUE CROSS/BLUE SHIELD | Admitting: Hematology & Oncology

## 2015-10-17 ENCOUNTER — Other Ambulatory Visit: Payer: Self-pay | Admitting: Adult Health

## 2015-10-17 DIAGNOSIS — Z1231 Encounter for screening mammogram for malignant neoplasm of breast: Secondary | ICD-10-CM

## 2015-10-21 ENCOUNTER — Ambulatory Visit (HOSPITAL_COMMUNITY)
Admission: RE | Admit: 2015-10-21 | Discharge: 2015-10-21 | Disposition: A | Discharge status: Home / Self Care | Payer: 59 | Source: Ambulatory Visit | Attending: Adult Health | Admitting: Adult Health

## 2015-10-21 DIAGNOSIS — Z1231 Encounter for screening mammogram for malignant neoplasm of breast: Secondary | ICD-10-CM | POA: Insufficient documentation

## 2015-11-09 ENCOUNTER — Ambulatory Visit (INDEPENDENT_AMBULATORY_CARE_PROVIDER_SITE_OTHER): Payer: 59 | Admitting: Orthopedic Surgery

## 2015-11-09 ENCOUNTER — Encounter: Payer: Self-pay | Admitting: Orthopedic Surgery

## 2015-11-09 VITALS — BP 117/76 | HR 92 | Ht 63.0 in | Wt 174.0 lb

## 2015-11-09 DIAGNOSIS — M546 Pain in thoracic spine: Secondary | ICD-10-CM

## 2015-11-09 DIAGNOSIS — M791 Myalgia, unspecified site: Secondary | ICD-10-CM

## 2015-11-09 DIAGNOSIS — M898X1 Other specified disorders of bone, shoulder: Secondary | ICD-10-CM

## 2015-11-09 DIAGNOSIS — M792 Neuralgia and neuritis, unspecified: Secondary | ICD-10-CM

## 2015-11-09 MED ORDER — METHOCARBAMOL 500 MG PO TABS: 500.0000 mg | ORAL_TABLET | Freq: Three times a day (TID) | ORAL | 1 refills | Status: DC

## 2015-11-09 NOTE — Patient Instructions (Signed)
You have received an injection of steroids into the joint. 15% of patients will have increased pain within the 24 hours postinjection.   This is transient and will go away.   We recommend that you use ice packs on the injection site for 20 minutes every 2 hours and extra strength Tylenol 2 tablets every 8 as needed until the pain resolves.  If you continue to have pain after taking the Tylenol and using the ice please call the office for further instructions.   Use heat Use muscle cream of choice Muscle relaxer

## 2015-11-09 NOTE — Progress Notes (Signed)
Patient ID: Holly Golden, female   DOB: 11-15-1970, 45 y.o.   MRN: [id number]  Chief Complaint  Patient presents with  . Back Pain    Back pain     HPI Holly Golden is a 45 y.o. female.  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 Radiates to about mid left side no pain in the epigastrium or over the stomach. She's had steroids ibuprofen heat seems to get better with heat no effect from steroids or ibuprofen. She has a splenic hemangioma which will need surgery she's had a recent ultrasound july were noted.  When she flexes forward she gets some relief. The pain is around the inferior border of the scapula  Review of Systems Review of Systems  Constitutional: Negative for chills and fever.  Gastrointestinal: Negative.   Genitourinary: Negative.     Past Medical History:  Diagnosis Date  . Bleeding gums 02/15/2015  . Breast nodule 08/15/2012   Left breast nodule at 2 oclock will get mammogram and [place]  . Elevated triglycerides with high cholesterol 07/14/2014  . Hair loss   . Hemorrhoids   . Kidney stones   . LUQ pain 02/15/2015  . 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 Raynaud's disease   . Rectal bleeding   . Seasonal allergies   . Stress 07/14/2014  . Swelling of hand 11/17/2012   Has bilateral swelling in hands, and they are cold  . UTI (urinary tract infection)   . Varicose veins     Past Surgical History:  Procedure Laterality Date  . CESAREAN SECTION     1991  . CHOLECYSTECTOMY     2010  . COLONOSCOPY  08/06/2011   Procedure: COLONOSCOPY;  Surgeon: Rogene Houston, MD;  Location: [facility];  Service: Endoscopy;  Laterality: N/A;  730  . ENDOVENOUS ABLATION SAPHENOUS VEIN W/ LASER Left 12-10-2013   EVLA  left small saphenous vein  and stab phlebectomy <10 Incision left leg  . ENDOVENOUS ABLATION SAPHENOUS VEIN W/ LASER Right 12-31-2013   right small saphenous vein  by Curt Jews MD  . left tube and ovary removed for a benign mass.     Marland Kitchen splenic hemangiomas      Social History Social History  Substance Use Topics  . Smoking status: Never Smoker  . Smokeless tobacco: Never Used  . Alcohol use No    No Known Allergies  No outpatient prescriptions have been marked as taking for the 11/09/15 encounter (Office Visit) with Carole Civil, MD.      Physical Exam Physical Exam BP 117/76   Pulse 92   Ht 5\' 3"  (1.6 m)   Wt 174 lb (78.9 kg)   LMP 09/09/2015   BMI 30.82 kg/m   Gen. appearance Normal grooming normal hygiene The patient is alert and oriented person place and time Mood is normal affect is normal Ambulatory status normal without gait disturbance  Exam of the thoracic and lumbar spine Bony intervals are nontender fall disc spaces  Tenderness in the left periscapular region inferior with palpable tenderness along the inferior border and medial border relieved by flexion no effect with extension no effect with rotation no effect with scapular rotation or resisted periscapular muscles. Neurovascular exam remains intact skin is normal pulses are good in each arm and on each side  Data Reviewed Ultrasound report negative for calculi  Assessment    Muscle strain parascapular region possible trigger point  Plan    Heat Muscle cream Trigger point injection        Arther Abbott 11/09/2015, 3:44 PM

## 2015-12-01 ENCOUNTER — Ambulatory Visit: Payer: Self-pay | Admitting: Surgery

## 2015-12-01 DIAGNOSIS — D1803 Hemangioma of intra-abdominal structures: Secondary | ICD-10-CM | POA: Insufficient documentation

## 2015-12-01 NOTE — H&P (Signed)
Josilyn R. Luber 12/01/2015 1:50 PM Location: [facility] Patient #: [id number] DOB: [birth year] Married / Language: English / Race: White Female  Mark All Reviewed History of Present Illness Adin Hector MD; 12/01/2015 6:20 PM) The patient is a 45 year old female who presents with a splenic disorder. 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  The patient is a 45 year old female who presents with a splenic disorder. Note for "Splenic disorder":  She was sent to me early in the year by her hematologist with chronic left back/flank pain and increasing lesions of the spleen. Possible need for splenectomy. Dr. Whitney Muse.  Patient returns. I had seen her earlier in the year. I had recommended considering splenectomy. She held off. She notes the pain is gone worse in her left posterior back on her rib cage just below her shoulder braids. Worse with deep breath or movement. She is seen back specialist with negative workup. She thought she might had a kidney stone. Workup was negative. Still has splenic lesions. No changes in her bowel habits. No drop in her platelets or other abnormalities on prior workups from last year. However she noted worsening pain over the summer. Ultrasound confirmed that she still had numerous splenic lesions. Presumed hemangiomas. Possibly others. No other abnormalities.  Pleasant woman. Comes today with her sister and mother. Worse in a doctor's office. Had some abdominal discomfort. Had a CAT scan that noted hypodense lesions in her spleen and a pelvic mass. Had resection of the pelvic mass. Splenic lesions have been followed. First noted 2005. More recently, she's had some LEFT flank discomfort to the point that she does not like to lie on that side. At least the past 6 months. No nausea or vomiting. Occasional constipation/diarrhea since her cholecystectomy years  ago. However not severe. Had colonoscopy 2013 with a few small polyps removed. Some hemorrhoids noted felt best likely be the source of some mild rectal bleeding. He has a little bleeding when she brushes her teeth but nothing severe. Has had nosebleeds a couple times but nothing severe. No hematuria. Sometimes she feels like she can bruising easily but nothing too severe. Had some varicose vein surgery in the past. Can walk several miles without difficulty. Was sent to hematology for concerns.  Repeat CT scan 2016 shows increased number of hypodense lesions on the spleen, replacing the spleen more. Because of symptoms of discomfort and more lesions, surgical consultation requested. Patient denies any history of fall or trauma. No fevers chills or sweats. Energy level okay. No weight loss. No family history of leukemia or lymphoma. No history of any neurological or skin issues. No skin bruises/lesions. No recent UTIs. No heartburn or reflux. No history of hepatitis or pancreatitis. No dysphagia to solids or liquids.    CLINICAL DATA: Left upper quadrant abdominal pain. Splenomegaly. Splenic and hepatic hemangiomatosis. Bleeding gums. Left upper quadrant abdominal pain.  EXAM: CT ABDOMEN AND PELVIS WITH CONTRAST  TECHNIQUE: Multidetector CT imaging of the abdomen and pelvis was performed using the standard protocol following bolus administration of intravenous contrast.  CONTRAST: 12mL OMNIPAQUE IOHEXOL 300 MG/ML SOLN  COMPARISON: 02/18/2015 and report from 01/21/2011  FINDINGS: Lower chest: Unremarkable  Hepatobiliary: Cholecystectomy. Suspected mild hepatic steatosis.  Pancreas: Unremarkable  Spleen: Numerous hypoenhancing masses are once again identified throughout the spleen. Degree of splenic parenchymal replacement is increased due to increased size and number of lesions. Upper normal splenic volume at 400 cc.  Adrenals/Urinary Tract: Simple  appearing 1.7 cm right mid kidney cyst, no difference in density on portal venous and delayed phase images. Small hypodense lesions in the left kidney are technically too small to characterize although statistically likely to be benign.  Stomach/Bowel: Unremarkable. Appendix normal.  Vascular/Lymphatic: Unremarkable  Reproductive: Unremarkable  Other: No supplemental non-categorized findings.  Musculoskeletal: Unremarkable  IMPRESSION: 1. Increase in the size and number of enhancing splenic lesions with a greater degree of replacement of the splenic parenchyma. Differential diagnostic considerations include splenic hemangiomatosis, Littoral cell angioma, lymphangiomatosis, lymphoma, or metastatic disease. However, given the relatively slow advancement from 2012, malignancy seems less likely. 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 2. Mild hepatic steatosis.   Electronically Signed By: Van Clines M.D. On: 03/25/2015 17:24   Problem List/Past Medical Adin Hector, MD; 12/01/2015 2:19 PM) SPLENIC MASS (R16.1) IRREGULAR BOWEL HABITS (R19.8)  Other Problems Adin Hector, MD; 12/01/2015 2:19 PM) Cholelithiasis Gastroesophageal Reflux Disease Hemorrhoids Hypercholesterolemia Kidney Stone Oophorectomy Left. Other disease, cancer, significant illness Vascular Disease  Past Surgical History Adin Hector, MD; 12/01/2015 2:19 PM) Cesarean Section - 1 Colon Polyp Removal - Colonoscopy Gallbladder Surgery - Laparoscopic Oral Surgery  Diagnostic Studies History Adin Hector, MD; 12/01/2015 2:19 PM) Colonoscopy 1-5 years ago Mammogram within last year Pap Smear 1-5 years ago  Allergies  Elbert Ewings, CMA; 12/01/2015 1:50 PM) No Known Drug Allergies 04/22/2015  Medication History Elbert Ewings, CMA; 12/01/2015 1:51 PM) ZyrTEC Allergy (10MG  Capsule, Oral) Active. Ibuprofen 200 (200MG  Tablet, Oral as needed) Active. Medications Reconciled  Social History Adin Hector, MD; 12/01/2015 2:19 PM) Caffeine use Carbonated beverages, Coffee, Tea. No alcohol use No drug use Tobacco use Never smoker.  Family History Adin Hector, MD; 12/01/2015 2:19 PM) Arthritis Father, Mother. Depression Brother. Migraine Headache Mother. Seizure disorder Brother, Mother. Thyroid problems Mother.  Pregnancy / Birth History Adin Hector, MD; 12/01/2015 2:19 PM) Age at menarche 9 years. Gravida 2 Maternal age 45-20 Para 2 Regular periods     Review of Systems Adin Hector MD; 12/01/2015 2:19 PM) Elta [place] as Reviewed General Not Present- Appetite Loss, Chills, Fatigue, Fever, Night Sweats, Weight Gain and Weight Loss. Skin Not Present- Change in Wart/Mole, Dryness, Hives, Jaundice, New Lesions, Non-Healing Wounds, Rash and Ulcer. HEENT Present- Nose Bleed, Seasonal Allergies and Wears glasses/contact lenses. Not Present- Earache, Hearing Loss, Hoarseness, Oral Ulcers, Ringing in the Ears, Sinus Pain, Sore Throat, Visual Disturbances and Yellow Eyes. Respiratory Not Present- Bloody sputum, Chronic Cough, Difficulty Breathing, Snoring and Wheezing. Breast Not Present- Breast Mass, Breast Pain, Nipple Discharge and Skin Changes. Cardiovascular Present- Swelling of Extremities. Not Present- Chest Pain, Difficulty Breathing Lying Down, Leg Cramps, Palpitations, Rapid Heart Rate and Shortness of Breath. Gastrointestinal Present- Abdominal Pain, Bloating, Bloody Stool, Chronic diarrhea, Constipation, Gets full quickly at meals, Hemorrhoids and Indigestion. Not Present- Change in Bowel Habits, Difficulty Swallowing, Excessive gas, Nausea, Rectal Pain and Vomiting. Female  Genitourinary Not Present- Frequency, Nocturia, Painful Urination, Pelvic Pain and Urgency. Musculoskeletal Present- Joint Pain, Joint Stiffness and Swelling of Extremities. Not Present- Back Pain, Muscle Pain and Muscle Weakness. Neurological Present- Headaches. Not Present- Decreased Memory, Fainting, Numbness, Seizures, Tingling, Tremor, Trouble walking and Weakness. Psychiatric Not Present- Anxiety, Bipolar, Change in Sleep Pattern, Depression, Fearful and Frequent crying. Endocrine  Present- Cold Intolerance and Hair Changes. Not Present- Excessive Hunger, Heat Intolerance, Hot flashes and New Diabetes. Hematology Present- Easy Bruising. Not Present- Excessive bleeding, Gland problems, HIV and Persistent Infections.  Vitals Elbert Ewings CMA; 12/01/2015 1:51 PM) 12/01/2015 1:51 PM Weight: 171 lb Height: 64in Body Surface Area: 1.83 m Body Mass Index: 29.35 kg/m  Temp.: 98.49F(Temporal)  Pulse: 92 (Regular)  BP: 126/76 (Sitting, Left Arm, Standard)      Physical Exam Adin Hector MD; 12/01/2015 6:22 PM)  General Mental Status-Alert. General Appearance-Not in acute distress, Not Sickly. Orientation-Oriented X3. Hydration-Well hydrated. Voice-Normal.  Integumentary Global Assessment Normal Exam - Axillae: non-tender, no inflammation or ulceration, no drainage. and Distribution of scalp and body hair is normal. General Characteristics Temperature - normal warmth is noted. Note: No telangiectasias. No port wine stains. No strawberry marks. No hyperpigmented lesions.  Head and Neck Head-normocephalic, atraumatic with no lesions or palpable masses. Face Global Assessment - atraumatic, no absence of expression. Neck Global Assessment - no abnormal movements, no bruit auscultated on the right, no bruit auscultated on the left, no decreased range of motion, non-tender. Trachea-midline. Thyroid Gland Characteristics - non-tender.  Eye Eyeball -  Left-Extraocular movements intact, No Nystagmus. Eyeball - Right-Extraocular movements intact, No Nystagmus. Cornea - Left-No Hazy. Cornea - Right-No Hazy. Sclera/Conjunctiva - Left-No scleral icterus, No Discharge. Sclera/Conjunctiva - Right-No scleral icterus, No Discharge. Pupil - Left-Direct reaction to light normal. Pupil - Right-Direct reaction to light normal.  ENMT Ears Pinna - Left - no drainage observed, no generalized tenderness observed. Right - no drainage observed, no generalized tenderness observed. Nose and Sinuses Nose - no destructive lesion observed. Nares - Left - quiet respiration. Right - quiet respiration. Mouth and Throat Lips - Upper Lip - no fissures observed, no pallor noted. Lower Lip - no fissures observed, no pallor noted. Nasopharynx - no discharge present. Oral Cavity/Oropharynx - Tongue - no dryness observed. Oral Mucosa - no cyanosis observed. Hypopharynx - no evidence of airway distress observed.  Chest and Lung Exam Inspection Movements - Normal and Symmetrical. Accessory muscles - No use of accessory muscles in breathing. Palpation Normal exam - Non-tender. Auscultation Breath sounds - Normal and Clear.  Cardiovascular Auscultation Rhythm - Regular. Murmurs & Other Heart Sounds - Normal exam - No Murmurs and No Systolic Clicks.  Abdomen Inspection Normal Exam - No Visible peristalsis and No Abnormal pulsations. Umbilicus - No Bleeding, No Urine drainage. Palpation/Percussion Normal exam - Soft, Non Tender, No Rebound tenderness, No Rigidity (guarding) and No Cutaneous hyperesthesia. Note: Soft and flat. Mild LEFT posterior flank/LEFT upper quadrant discomfort. Worse on left inferior posterior rib cage. No guarding. Suprapubic region clear. No pain on RIGHT side.  Female Genitourinary Sexual Maturity Tanner 5 - Adult hair pattern. Note: No vaginal bleeding nor discharge  Peripheral Vascular Upper Extremity Inspection  - Left - No Cyanotic nailbeds, Not Ischemic. Right - No Cyanotic nailbeds, Not Ischemic.  Neurologic Neurologic evaluation reveals -normal attention span and ability to concentrate, able to name objects and repeat phrases. Appropriate fund of knowledge , normal sensation and normal coordination. Mental Status Affect - not angry, not paranoid. Cranial Nerves-Normal Bilaterally. Gait-Normal.  Neuropsychiatric Mental status exam performed with findings of-able to articulate well with normal speech/language, rate, volume and coherence, thought content normal with ability to perform basic computations and apply abstract reasoning and no evidence of hallucinations, delusions, obsessions or homicidal/suicidal ideation.  Musculoskeletal Global Assessment Spine, Ribs and Pelvis - no instability, subluxation or laxity. Right Upper  Extremity - no instability, subluxation or laxity. Note: No pain on cervical/thoracic/lumbar/sacral spine.  Lymphatic Head & Neck General Head & Neck Lymphatics: Bilateral - Description - No Localized lymphadenopathy. Axillary General Axillary Region: Bilateral - Description - No Localized lymphadenopathy. Femoral & Inguinal Generalized Femoral & Inguinal Lymphatics: Left: Right - Description - No Localized lymphadenopathy. Description - No Localized lymphadenopathy.    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 Increase in the size and number of enhancing splenic lesions with a greater degree of replacement of the splenic parenchyma. Differential diagnostic considerations include splenic hemangiomatosis, Littoral cell angioma, lymphangiomatosis, lymphoma, or metastatic disease.  I have discussed the case with my colleagues, Dr.'s [place]. d/w Dr Kieth Brightly as well. Reviewed the films with the patient and her family.  Given the fact that she's had more lesions over the past  decade and she's being having some symptoms of LEFT flank discomfort greater than a year despite negative workup by orthopedics, negative urological workup, negative GI workup, I'm concerned that observation is not the best course. She does not have hard indications of hypersplenism with severe thrombocytopenia or uncontrollable bleeding. I am skeptical that she has a high risk of spontaneous rupture aside from severe trauma in which he would obviously try to avoid.  However I'm concerned that she will continue to get more of these lesions. Her pain has not resolved despite an anti-inflammatory regimen. I don't have any other etiology for pain. She [place] where her spleen is. Therefore, I still recommend splenectomy for diagnosis and possible symptom relief.  Would need preoperative immunization.  She is now ready to consider surgery but is still somewhat nervous about it. She agrees she's exhaust other etiologies in the differential diagnosis. She understands physician 100% guarantee the solve the problem. She is willing to reconsider surgery, especially since I plan to do it laparoscopically. I think she is certainly reasonable to resect mobilize and morcellate.  She says she always has problems with urinary dysfunction every time they put a catheter. She would like to avoid that. Since that shouldn't be a super Romanoff case, we'll try and hold off. Seems counterintuitive, but I did not want to press the issue.  Current Plans Pt Education - Spleen Diseases: discussed with patient and provided information. Pt Education - [facility] Free Text Education/Instructions Pt Education - [facility] Pain control - tylenol only: discussed with patient and provided information. You are being scheduled for surgery - Our schedulers will call you.  You should hear from our office's scheduling department within 5 working days about the location, date, and time of surgery. We try to make accommodations for patient's preferences  in scheduling surgery, but sometimes the OR schedule or the surgeon's schedule prevents [place] from making those accommodations.  If you have not heard from our office [phone]) in 5 working days, call the office and ask for your surgeon's nurse.  If you have other questions about your diagnosis, plan, or surgery, call the office and ask for your surgeon's nurse.  Pt Education - [facility] Laparosopic Post Op HCI (Klover Priestly) IRREGULAR BOWEL HABITS (R19.8) Impression: No strong evidence of obstruction or other abnormality. Perhaps constipation with occasional overflow.  I recommended adding a fiber bowel regimen to see if that would help her out.  Current Plans Pt Education - [facility] Good Bowel Health (Kevaughn Ewing)  Adin Hector, M.D., F.A.C.S. Gastrointestinal and Minimally Invasive Surgery [facility] [address]  [address] [place], [place] [address] [phone] Main / Paging

## 2016-01-18 ENCOUNTER — Encounter (HOSPITAL_COMMUNITY): Admission: RE | Admit: 2016-01-18 | Payer: 59 | Source: Ambulatory Visit

## 2016-01-20 ENCOUNTER — Inpatient Hospital Stay: Admit: 2016-01-20 | Payer: 59 | Admitting: Surgery

## 2016-01-20 SURGERY — SPLENECTOMY, LAPAROSCOPIC
Anesthesia: General

## 2016-02-16 ENCOUNTER — Encounter (INDEPENDENT_AMBULATORY_CARE_PROVIDER_SITE_OTHER): Payer: 59 | Admitting: Family Medicine

## 2016-03-12 ENCOUNTER — Ambulatory Visit (INDEPENDENT_AMBULATORY_CARE_PROVIDER_SITE_OTHER): Payer: 59 | Admitting: Family Medicine

## 2016-03-12 ENCOUNTER — Encounter (INDEPENDENT_AMBULATORY_CARE_PROVIDER_SITE_OTHER): Payer: Self-pay | Admitting: Family Medicine

## 2016-03-12 VITALS — BP 133/82 | HR 69 | Temp 98.0°F | Ht 64.0 in | Wt 167.0 lb

## 2016-03-12 DIAGNOSIS — K760 Fatty (change of) liver, not elsewhere classified: Secondary | ICD-10-CM | POA: Diagnosis not present

## 2016-03-12 DIAGNOSIS — Z9189 Other specified personal risk factors, not elsewhere classified: Secondary | ICD-10-CM | POA: Diagnosis not present

## 2016-03-12 DIAGNOSIS — E663 Overweight: Secondary | ICD-10-CM

## 2016-03-12 NOTE — Progress Notes (Signed)
Holly Golden was seen today for a new patient obesity vitis. Her BMI is under 30, so she currently does not have obesity but is at risk for obesity. We did a counselling visit today instead.  She has a personal  history of NAFLD. She denies abdominal pain or jaundice. She has a family history of diabetes and heart disease. She is worried that she continues to gain weight and will have these health problems as well.  PLAN Pt was advised of the importance of just 10  lbs of weight loss to improve her fatty liver. She was educated that weight loss was currently the only way to treat fatty liver, but she doesn't have to lose down to ideal body weight to be healthier. Her basal metabolic rate is [id number], and she was advised to keep a food journal on myfitnesspal with a calorie goal of 1200 per day and at least 65 gm of protein daily. She was advised to increase her vegetables and watch for emotional eating habits.She was advised against cutting her calories below this as inadequate calories will very likely reduce her metabolic rate. She was encouraged to work on strengthening exercise to help with her metabolism with a goal of 150 minutes per week. 30 minutes of our 30 minute visit today was spent on the above counseling.  Although her BMI is not high enough to start our program, if after a month she feels she is still struggling, we can schedule her for a dietician visit in our office.

## 2016-04-02 DIAGNOSIS — L659 Nonscarring hair loss, unspecified: Secondary | ICD-10-CM | POA: Diagnosis not present

## 2016-04-02 DIAGNOSIS — L719 Rosacea, unspecified: Secondary | ICD-10-CM | POA: Diagnosis not present

## 2016-07-09 DIAGNOSIS — L719 Rosacea, unspecified: Secondary | ICD-10-CM | POA: Diagnosis not present

## 2016-07-09 DIAGNOSIS — L659 Nonscarring hair loss, unspecified: Secondary | ICD-10-CM | POA: Diagnosis not present

## 2016-08-03 ENCOUNTER — Encounter (INDEPENDENT_AMBULATORY_CARE_PROVIDER_SITE_OTHER): Payer: Self-pay | Admitting: *Deleted

## 2016-12-24 DIAGNOSIS — L659 Nonscarring hair loss, unspecified: Secondary | ICD-10-CM | POA: Diagnosis not present

## 2016-12-24 DIAGNOSIS — L719 Rosacea, unspecified: Secondary | ICD-10-CM | POA: Diagnosis not present

## 2016-12-28 DIAGNOSIS — I7300 Raynaud's syndrome without gangrene: Secondary | ICD-10-CM | POA: Diagnosis not present

## 2017-02-20 NOTE — Progress Notes (Signed)
Office Visit Note  Patient: Holly Golden             Date of Birth: 11/03/1970           MRN: [id number]             PCP: Rory Percy, MD Referring: Rory Percy, MD Visit Date: 02/28/2017 Occupation: LPN at [employer]    Subjective:  Pain in hands and Raynaud's phenomenon.   History of Present Illness: Holly Golden is a 46 y.o. female seen in consultation per request of her PCP. According to patient her symptoms started in her mid 52s with Raynaud's phenomenon. At the time she was seen by her PCP and was advised amlodipine. She decided not to take it. In the last 5 years she states her Raynaud's has been worse with discoloration in her hands and feet. She also has been experiencing numbness and tingling in her hands. In the last couple of years she's been experiencing increased pain and swelling in her hands as well. She denies pain or swelling in any other joints. She gives history of sicca symptoms. She's also had hair loss since she was in her 39s.   Activities of Daily Living:  Patient reports morning stiffness for 30 minutes.   Patient Denies nocturnal pain.  Difficulty dressing/grooming: Denies Difficulty climbing stairs: Denies Difficulty getting out of chair: Denies Difficulty using hands for taps, buttons, cutlery, and/or writing: Denies   Review of Systems  Constitutional: Positive for fatigue. Negative for night sweats, weight gain, weight loss and weakness.  HENT: Positive for bleeding gums. Negative for mouth sores, trouble swallowing, trouble swallowing, mouth dryness and nose dryness.   Eyes: Positive for dryness. Negative for pain, redness and visual disturbance.  Respiratory: Negative for cough, shortness of breath and difficulty breathing.   Cardiovascular: Negative for chest pain, palpitations, hypertension, irregular heartbeat and swelling in legs/feet.  Gastrointestinal: Positive for blood in stool and diarrhea. Negative for constipation.   History of polyps. Last colonoscopy 5 years ago. Postcholecystectomy diarrhea  Endocrine: Negative for increased urination.  Genitourinary: Negative for vaginal dryness.  Musculoskeletal: Positive for arthralgias, joint pain, joint swelling and morning stiffness. Negative for myalgias, muscle weakness, muscle tenderness and myalgias.  Skin: Positive for color change, rash and hair loss. Negative for skin tightness, ulcers and sensitivity to sunlight.       History of rosacea  Allergic/Immunologic: Negative for susceptible to infections.  Neurological: Negative for dizziness, memory loss and night sweats.  Hematological: Negative for swollen glands.  Psychiatric/Behavioral: Negative for depressed mood and sleep disturbance. The patient is not nervous/anxious.     PMFS History:  Patient Active Problem List   Diagnosis Date Noted  . 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 LUQ pain 02/15/2015  . Bleeding gums 02/15/2015  . Elevated triglycerides with high cholesterol 07/14/2014  . Hair loss 07/14/2014  . Stress 07/14/2014  . Rectal bleeding 11/26/2013  . Varicose veins of lower extremities with other complications 57/32/2025  . Swelling of hand 11/17/2012  . Breast nodule 08/15/2012  . Splenic disease 01/08/2011    Past Medical History:  Diagnosis Date  . Anxiety   . Bleeding gums 02/15/2015  . Breast nodule 08/15/2012   Left breast nodule at 2 oclock will get mammogram and [place]  . Elevated triglycerides with high cholesterol 07/14/2014  . Fatty liver   . Gall bladder disease   . GERD (gastroesophageal reflux disease)   . Hair loss   . Hemorrhoids   .  Joint pain   . Kidney stones   . LUQ pain 02/15/2015  . 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 Raynaud's disease   . Rectal bleeding   . Seasonal allergies   . Stress 07/14/2014  . Swelling    feet or hands  . Swelling of hand 11/17/2012   Has bilateral swelling in hands, and they are cold  . UTI (urinary tract infection)    . Varicose veins     Family History  Problem Relation Age of Onset  . Cancer Maternal Grandmother        pancreatic  . Cancer Paternal Grandmother        breast and colon  . Seizures Mother   . Hyperlipidemia Mother   . Thyroid disease Mother   . Anxiety disorder Mother   . Sleep apnea Mother        no longer on CPAP   . Obesity Mother   . Seizures Brother   . Depression Brother   . Stroke Paternal Grandfather   . Cirrhosis Maternal Grandfather   . Healthy Son   . Diabetes Other   . Healthy Son   . Cancer Cousin        colon, hodgkin's  . Diabetes Cousin    Past Surgical History:  Procedure Laterality Date  . CESAREAN SECTION     1991  . CHOLECYSTECTOMY     2010  . COLONOSCOPY  08/06/2011   Procedure: COLONOSCOPY;  Surgeon: Rogene Houston, MD;  Location: [facility];  Service: Endoscopy;  Laterality: N/A;  730  . ENDOVENOUS ABLATION SAPHENOUS VEIN W/ LASER Left 12-10-2013   EVLA  left small saphenous vein  and stab phlebectomy <10 Incision left leg  . ENDOVENOUS ABLATION SAPHENOUS VEIN W/ LASER Right 12-31-2013   right small saphenous vein  by Curt Jews MD  . left tube and ovary removed for a benign mass.    Marland Kitchen splenic hemangiomas     Social History   Social History Narrative  . Not on file     Objective: Vital Signs: BP 130/84 (BP Location: Left Arm, Patient Position: Sitting, Cuff Size: Normal)   Pulse 72   Resp 15   Ht 5\' 4"  (1.626 m)   Wt 172 lb (78 kg)   BMI 29.52 kg/m    Physical Exam  Constitutional: She is oriented to person, place, and time. She appears well-developed and well-nourished.  HENT:  Head: Normocephalic and atraumatic.  Eyes: Conjunctivae and EOM are normal.  Neck: Normal range of motion.  Cardiovascular: Normal rate, regular rhythm, normal heart sounds and intact distal pulses.  Pulmonary/Chest: Effort normal and breath sounds normal.  Abdominal: Soft. Bowel sounds are normal.  Lymphadenopathy:    She has no cervical  adenopathy.  Neurological: She is alert and oriented to person, place, and time.  Skin: Skin is warm and dry. Capillary refill takes 2 to 3 seconds.  Psychiatric: She has a normal mood and affect. Her behavior is normal.  Nursing note and vitals reviewed.    Musculoskeletal Exam: C-spine and thoracic lumbar spine good range of motion. Shoulder joints elbow joints wrist joint MCPs PIPs DIPs with good range of motion. She is some tenderness across her PIP/DIP joints. Hip joints knee joints ankles MTPs PIPs are good range of motion with no synovitis. She is some tenderness across the PIP joints of her hands. No synovitis was noted on examination.  CDAI Exam: No CDAI exam completed.  Investigation: No additional findings. 11/15/2015 CMP normal, CBC normal  Imaging: No results found.  Speciality Comments: No specialty comments available.    Procedures:  No procedures performed Allergies: Patient has no known allergies.   Assessment / Plan:     Visit Diagnoses: Pain in both hands - patient gives history of significant stiffness in her hands in the morning and intermittent swelling. The symptoms have been getting worse over the last 5 years. I do not see any synovitis on examination she does have tenderness. 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 I did not see any other capillary changes. No digital ulcers were noted. We discussed the option of adding amlodipine. Indications CONTRAINDICATIONS WERE DISCUSSED AND PRESCRIPTION FOR AMLODIPINE 5 MG BY MOUTH DAILY TOTAL 30 TABLETS WITH 2 REFILLS WAS GIVEN. SIDE EFFECTS WERE DISCUSSED. I WILL OBTAIN FOLLOWING LABS TO LOOK FOR ANY UNDERLYING INFLAMMATORY OR AUTOIMMUNE CAUSES. Plan: Urinalysis, Routine w reflex microscopic, Sedimentation rate, ANA, Beta-2 glycoprotein  antibodies, Cardiolipin antibodies, IgG, IgM, IgA, C3 and C4, Sjogrens syndrome-B extractable nuclear antibody, Sjogrens syndrome-A extractable nuclear antibody, Anti-Smith antibody, RNP Antibody, Anti-scleroderma antibody, Anti-DNA antibody, double-stranded, Angiotensin converting enzyme, Pan-ANCA, Cryoglobulin, Hepatitis C antibody, Hepatitis B surface antigen, Hepatitis B core antibody, IgM, Lupus Anticoagulant Eval w/Reflex  Hair loss: Patient gives history of chronic hair loss for at least 15 years. Other fatigue - Plan: CBC with Differential/Platelet, COMPLETE METABOLIC PANEL WITH GFR, CK, TSH, Serum protein electrophoresis with reflex, Glucose 6 phosphate dehydrogenase, IgG, IgA, IgM  History of rosacea    Orders: Orders Placed This Encounter  Procedures  . XR Hand 2 View Right  . XR Hand 2 View Left  . CBC with Differential/Platelet  . COMPLETE METABOLIC PANEL WITH GFR  . Urinalysis, Routine w reflex microscopic  . Sedimentation rate  . CK  . TSH  . ANA  . Rheumatoid factor  . Cyclic citrul peptide antibody, IgG  . Serum protein electrophoresis with reflex  . Glucose 6 phosphate dehydrogenase  . IgG, IgA, IgM  . Beta-2 glycoprotein antibodies  . Cardiolipin antibodies, IgG, IgM, IgA  . C3 and C4  . Sjogrens syndrome-B extractable nuclear antibody  . Sjogrens syndrome-A extractable nuclear antibody  . Anti-Smith antibody  . RNP Antibody  . Anti-scleroderma antibody  . Anti-DNA antibody, double-stranded  . Angiotensin converting enzyme  . Pan-ANCA  . Cryoglobulin  . Hepatitis C antibody  . Hepatitis B surface antigen  . Hepatitis B core antibody, IgM  . Lupus Anticoagulant Eval w/Reflex   Meds ordered this encounter  Medications  . amLODipine (NORVASC) 5 MG tablet    Sig: Take 1 tablet (5 mg total) by mouth daily.    Dispense:  30 tablet    Refill:  2    Face-to-face time spent with patient was 50 minutes. Greater than 50% of time was spent in counseling and  coordination of care.  Follow-Up Instructions: Return for Arthralgia, Raynauds.   Bo Merino, MD  Note - This record has been created using Editor, commissioning.  Chart creation errors have been sought, but may not always  have been located. Such creation errors do not reflect on  the standard of medical care.

## 2017-02-28 ENCOUNTER — Ambulatory Visit (INDEPENDENT_AMBULATORY_CARE_PROVIDER_SITE_OTHER): Payer: 59 | Admitting: Rheumatology

## 2017-02-28 ENCOUNTER — Encounter: Payer: Self-pay | Admitting: Rheumatology

## 2017-02-28 ENCOUNTER — Ambulatory Visit (INDEPENDENT_AMBULATORY_CARE_PROVIDER_SITE_OTHER): Payer: Self-pay

## 2017-02-28 VITALS — BP 130/84 | HR 72 | Resp 15 | Ht 64.0 in | Wt 172.0 lb

## 2017-02-28 DIAGNOSIS — I7300 Raynaud's syndrome without gangrene: Secondary | ICD-10-CM | POA: Diagnosis not present

## 2017-02-28 DIAGNOSIS — M79642 Pain in left hand: Secondary | ICD-10-CM | POA: Diagnosis not present

## 2017-02-28 DIAGNOSIS — L659 Nonscarring hair loss, unspecified: Secondary | ICD-10-CM

## 2017-02-28 DIAGNOSIS — R5383 Other fatigue: Secondary | ICD-10-CM | POA: Diagnosis not present

## 2017-02-28 DIAGNOSIS — M79641 Pain in right hand: Secondary | ICD-10-CM

## 2017-02-28 DIAGNOSIS — Z872 Personal history of diseases of the skin and subcutaneous tissue: Secondary | ICD-10-CM

## 2017-02-28 MED ORDER — AMLODIPINE BESYLATE 5 MG PO TABS: 5.0000 mg | ORAL_TABLET | Freq: Every day | ORAL | 2 refills | Status: DC

## 2017-03-04 NOTE — Progress Notes (Signed)
Mild neutropenia. Otherwise labs are unremarkable. Will discuss at follow-up visit.

## 2017-03-05 LAB — CBC WITH DIFFERENTIAL/PLATELET
Basophils Absolute: 20 {cells}/uL (ref 0–200)
Basophils Relative: 0.6 %
Eosinophils Absolute: 41 {cells}/uL (ref 15–500)
Eosinophils Relative: 1.2 %
HCT: 41.9 % (ref 35.0–45.0)
Hemoglobin: 14.3 g/dL (ref 11.7–15.5)
Lymphs Abs: 1183 {cells}/uL (ref 850–3900)
MCH: 28.8 pg (ref 27.0–33.0)
MCHC: 34.1 g/dL (ref 32.0–36.0)
MCV: 84.3 fL (ref 80.0–100.0)
MPV: 10.1 fL (ref 7.5–12.5)
Monocytes Relative: 6.5 %
Neutro Abs: 1935 {cells}/uL (ref 1500–7800)
Neutrophils Relative %: 56.9 %
Platelets: 198 Thousand/uL (ref 140–400)
RBC: 4.97 Million/uL (ref 3.80–5.10)
RDW: 12.9 % (ref 11.0–15.0)
Total Lymphocyte: 34.8 %
WBC mixed population: 221 {cells}/uL (ref 200–950)
WBC: 3.4 Thousand/uL — ABNORMAL LOW (ref 3.8–10.8)

## 2017-03-05 LAB — CRYOGLOBULIN: CRYOGLOBULIN, QUALITATIVE ANALYSIS: NOT DETECTED

## 2017-03-05 LAB — SEDIMENTATION RATE: Sed Rate: 9 mm/h (ref 0–20)

## 2017-03-05 LAB — COMPLETE METABOLIC PANEL WITH GFR
AG Ratio: 1.5 (calc) (ref 1.0–2.5)
ALKALINE PHOSPHATASE (APISO): 68 U/L (ref 33–115)
ALT: 10 U/L (ref 6–29)
AST: 13 U/L (ref 10–35)
Albumin: 4.8 g/dL (ref 3.6–5.1)
BUN: 11 mg/dL (ref 7–25)
CALCIUM: 9.4 mg/dL (ref 8.6–10.2)
CO2: 26 mmol/L (ref 20–32)
CREATININE: 0.83 mg/dL (ref 0.50–1.10)
Chloride: 102 mmol/L (ref 98–110)
GFR, Est African American: 98 mL/min/{1.73_m2} (ref >=60)
GFR, Est Non African American: 85 mL/min/{1.73_m2} (ref >=60)
GLUCOSE: 79 mg/dL (ref 65–99)
Globulin: 3.1 g/dL (calc) (ref 1.9–3.7)
Potassium: 3.7 mmol/L (ref 3.5–5.3)
Sodium: 138 mmol/L (ref 135–146)
Total Bilirubin: 0.5 mg/dL (ref 0.2–1.2)
Total Protein: 7.9 g/dL (ref 6.1–8.1)

## 2017-03-05 LAB — PROTEIN ELECTROPHORESIS, SERUM, WITH REFLEX
Albumin ELP: 4.7 g/dL (ref 3.8–4.8)
Alpha 1: 0.3 g/dL (ref 0.2–0.3)
Alpha 2: 0.7 g/dL (ref 0.5–0.9)
Beta 2: 0.4 g/dL (ref 0.2–0.5)
Beta Globulin: 0.5 g/dL (ref 0.4–0.6)
Gamma Globulin: 1.1 g/dL (ref 0.8–1.7)
Total Protein: 7.8 g/dL (ref 6.1–8.1)

## 2017-03-05 LAB — HEPATITIS C ANTIBODY
Hepatitis C Ab: NONREACTIVE
SIGNAL TO CUT-OFF: 0.04 (ref <1.00)

## 2017-03-05 LAB — LUPUS ANTICOAGULANT EVAL W/ REFLEX
PTT LA SCREEN: 31 s (ref <=40)
dRVVT Screen: 43 s (ref <=45)

## 2017-03-05 LAB — HEPATITIS B SURFACE ANTIGEN: HEP B S AG: NONREACTIVE

## 2017-03-05 LAB — URINALYSIS, ROUTINE W REFLEX MICROSCOPIC
Bilirubin Urine: NEGATIVE
Glucose, UA: NEGATIVE
Hgb urine dipstick: NEGATIVE
Leukocytes, UA: NEGATIVE
Nitrite: NEGATIVE
Protein, ur: NEGATIVE
Specific Gravity, Urine: 1.025 (ref 1.001–1.03)
pH: <=5 (ref 5.0–8.0)

## 2017-03-05 LAB — BETA-2 GLYCOPROTEIN ANTIBODIES
Beta-2 Glyco 1 IgA: <9 SAU (ref <=20)
Beta-2 Glyco 1 IgM: <9 SMU (ref <=20)
Beta-2 Glyco I IgG: <9 SGU (ref <=20)

## 2017-03-05 LAB — IGG, IGA, IGM
IgG (Immunoglobin G), Serum: 1251 mg/dL (ref 694–1618)
IgM, Serum: 60 mg/dL (ref 48–271)
Immunoglobulin A: 220 mg/dL (ref 81–463)

## 2017-03-05 LAB — ANTI-SCLERODERMA ANTIBODY: SCLERODERMA (SCL-70) (ENA) ANTIBODY, IGG: NEGATIVE AI

## 2017-03-05 LAB — CARDIOLIPIN ANTIBODIES, IGG, IGM, IGA
Anticardiolipin IgA: <11 [APL'U]
Anticardiolipin IgG: <14 [GPL'U]

## 2017-03-05 LAB — HEPATITIS B CORE ANTIBODY, IGM: HEP B C IGM: NONREACTIVE

## 2017-03-05 LAB — ANTI-DNA ANTIBODY, DOUBLE-STRANDED: ds DNA Ab: <1 IU/mL

## 2017-03-05 LAB — ANA: Anti Nuclear Antibody(ANA): NEGATIVE

## 2017-03-05 LAB — C3 AND C4
C3 Complement: 162 mg/dL (ref 83–193)
C4 Complement: 38 mg/dL (ref 15–57)

## 2017-03-05 LAB — PAN-ANCA
ANCA Screen: NEGATIVE
Myeloperoxidase Abs: <1 AI
Serine Protease 3: <1 AI

## 2017-03-05 LAB — GLUCOSE 6 PHOSPHATE DEHYDROGENASE: G-6PDH: 15.1 U/g{Hb} (ref 7.0–20.5)

## 2017-03-05 LAB — ANTI-SMITH ANTIBODY: ENA SM AB SER-ACNC: NEGATIVE AI

## 2017-03-05 LAB — SJOGRENS SYNDROME-A EXTRACTABLE NUCLEAR ANTIBODY: SSA (RO) (ENA) ANTIBODY, IGG: NEGATIVE AI

## 2017-03-05 LAB — ANGIOTENSIN CONVERTING ENZYME: Angiotensin-Converting Enzyme: 28 U/L (ref 9–67)

## 2017-03-05 LAB — CYCLIC CITRUL PEPTIDE ANTIBODY, IGG: Cyclic Citrullin Peptide Ab: <16 U

## 2017-03-05 LAB — TSH: TSH: 2.24 m[IU]/L

## 2017-03-05 LAB — RNP ANTIBODY: RIBONUCLEIC PROTEIN(ENA) ANTIBODY, IGG: NEGATIVE AI

## 2017-03-05 LAB — SJOGRENS SYNDROME-B EXTRACTABLE NUCLEAR ANTIBODY: SSB (La) (ENA) Antibody, IgG: <1 AI

## 2017-03-05 LAB — CK: Total CK: 44 U/L (ref 29–143)

## 2017-03-05 LAB — RHEUMATOID FACTOR

## 2017-03-11 ENCOUNTER — Ambulatory Visit: Payer: Self-pay | Admitting: Surgery

## 2017-03-11 ENCOUNTER — Other Ambulatory Visit: Payer: Self-pay | Admitting: Adult Health

## 2017-03-11 DIAGNOSIS — R198 Other specified symptoms and signs involving the digestive system and abdomen: Secondary | ICD-10-CM | POA: Diagnosis not present

## 2017-03-11 DIAGNOSIS — Z1231 Encounter for screening mammogram for malignant neoplasm of breast: Secondary | ICD-10-CM

## 2017-03-11 DIAGNOSIS — R161 Splenomegaly, not elsewhere classified: Secondary | ICD-10-CM | POA: Diagnosis not present

## 2017-03-11 NOTE — H&P (Signed)
Safaa Stingley Staton Documented: 03/11/2017 3:07 PM Location: [facility] Patient #: [id number] DOB: 1970-11-30 Married / Language: English / Race: White Female   History of Present Illness Adin Hector, MD; 03/11/2017 3:13 PM) The patient is a 47 year old female who presents with a splenic disorder. 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  The patient is a 47 year old female who presents with a splenic disorder. Note for "Splenic disorder":  She was sent to me early in the year by her hematologist with chronic left back/flank pain and increasing lesions of the spleen. Possible need for splenectomy. Dr. Whitney Muse.  Patient returns. I had seen her earlier in the year. I had recommended considering splenectomy. She held off. She notes the pain is gone worse in her left posterior back on her rib cage just below her shoulder braids. Worse with deep breath or movement. She is seen back specialist with negative workup. She thought she might had a kidney stone. Workup was negative. Still has splenic lesions. No changes in her bowel habits. No drop in her platelets or other abnormalities on prior workups from last year. However she noted worsening pain over the summer. Ultrasound confirmed that she still had numerous splenic lesions. Presumed hemangiomas. Possibly others. No other abnormalities.  Pleasant woman. Comes today with her sister and mother. Worse in a doctor's office. Had some abdominal discomfort. Had a CAT scan that noted hypodense lesions in her spleen and a pelvic mass. Had resection of the pelvic mass. Splenic lesions have been followed. First noted 2005. More recently, she's had some LEFT flank discomfort to the point that she does not like to lie on that side. At least the past 6 months. No nausea or vomiting. Occasional constipation/diarrhea since her cholecystectomy years ago.  However not severe. Had colonoscopy 2013 with a few small polyps removed. Some hemorrhoids noted felt best likely be the source of some mild rectal bleeding. He has a little bleeding when she brushes her teeth but nothing severe. Has had nosebleeds a couple times but nothing severe. No hematuria. Sometimes she feels like she can bruising easily but nothing too severe. Had some varicose vein surgery in the past. Can walk several miles without difficulty. Was sent to hematology for concerns.  Repeat CT scan 2016 shows increased number of hypodense lesions on the spleen, replacing the spleen more. Because of symptoms of discomfort and more lesions, surgical consultation requested. Patient denies any history of fall or trauma. No fevers chills or sweats. Energy level okay. No weight loss. No family history of leukemia or lymphoma. No history of any neurological or skin issues. No skin bruises/lesions. No recent UTIs. No heartburn or reflux. No history of hepatitis or pancreatitis. No dysphagia to solids or liquids.    CLINICAL DATA: Left upper quadrant abdominal pain. Splenomegaly. Splenic and hepatic hemangiomatosis. Bleeding gums. Left upper quadrant abdominal pain.  EXAM: CT ABDOMEN AND PELVIS WITH CONTRAST  TECHNIQUE: Multidetector CT imaging of the abdomen and pelvis was performed using the standard protocol following bolus administration of intravenous contrast.  CONTRAST: 134mL OMNIPAQUE IOHEXOL 300 MG/ML SOLN  COMPARISON: 02/18/2015 and report from 01/21/2011  FINDINGS: Lower chest: Unremarkable  Hepatobiliary: Cholecystectomy. Suspected mild hepatic steatosis.  Pancreas: Unremarkable  Spleen: Numerous hypoenhancing masses are once again identified throughout the spleen. Degree of splenic parenchymal replacement is increased due to increased size and number of lesions. Upper normal splenic volume at 400 cc.  Adrenals/Urinary Tract: Simple appearing  1.7 cm right mid kidney cyst, no difference in density on portal venous and delayed phase images. Small hypodense lesions in the left kidney are technically too small to characterize although statistically likely to be benign.  Stomach/Bowel: Unremarkable. Appendix normal.  Vascular/Lymphatic: Unremarkable  Reproductive: Unremarkable  Other: No supplemental non-categorized findings.  Musculoskeletal: Unremarkable  IMPRESSION: 1. Increase in the size and number of enhancing splenic lesions with a greater degree of replacement of the splenic parenchyma. Differential diagnostic considerations include splenic hemangiomatosis, Littoral cell angioma, lymphangiomatosis, lymphoma, or metastatic disease. However, given the relatively slow advancement from 2012, malignancy seems less likely. 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 2. Mild hepatic steatosis.   Electronically Signed By: Van Clines M.D. 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 Ibuprofen 200 (200MG   Tablet, Oral as needed) Active.  Social History Adin Hector, MD; 03/11/2017 3:13 PM) Caffeine use  Carbonated beverages, Coffee, Tea. No alcohol use  No drug use  Tobacco use  Never smoker.  Family History Adin Hector, MD; 03/11/2017 3:13 PM) Arthritis  Father, Mother. Depression  Brother. Migraine Headache  Mother. Seizure disorder  Brother, Mother. Thyroid problems  Mother.  Pregnancy / Birth History Adin Hector, MD; 03/11/2017 3:13 PM) Age at menarche  33 years. Gravida  2 Maternal age  45-20 Para  2 Regular periods   Other Problems Adin Hector, MD; 03/11/2017 3:13 PM) Cholelithiasis  Gastroesophageal Reflux Disease  Hemorrhoids  Hypercholesterolemia  Kidney Stone  Oophorectomy  Left. Other disease, cancer, significant illness  Vascular Disease     Review of Systems Adin Hector, MD; 03/11/2017 3:13 PM) General Not Present- Appetite Loss, Chills, Fatigue, Fever, Night Sweats, Weight Gain and Weight Loss. Skin Not Present- Change in Wart/Mole, Dryness, Hives, Jaundice, New Lesions, Non-Healing Wounds, Rash and Ulcer. HEENT Present- Nose Bleed, Seasonal Allergies and Wears glasses/contact lenses. Not Present- Earache, Hearing Loss, Hoarseness, Oral Ulcers, Ringing in the Ears, Sinus Pain, Sore Throat, Visual Disturbances and Yellow Eyes. Respiratory Not Present- Bloody sputum, Chronic Cough, Difficulty Breathing, Snoring and Wheezing. Breast Not Present- Breast Mass, Breast Pain, Nipple Discharge and Skin Changes. Cardiovascular Present- Swelling of Extremities. Not Present- Chest Pain, Difficulty Breathing Lying Down, Leg Cramps, Palpitations, Rapid Heart Rate and Shortness of Breath. Gastrointestinal Present- Abdominal Pain, Bloating, Bloody Stool, Chronic diarrhea, Constipation, Gets full quickly at meals, Hemorrhoids and Indigestion. Not Present- Change in Bowel Habits, Difficulty Swallowing, Excessive gas, Nausea, Rectal Pain and  Vomiting. Female Genitourinary Not Present- Frequency, Nocturia, Painful Urination, Pelvic Pain and Urgency. Musculoskeletal Present- Joint Pain, Joint Stiffness and Swelling of Extremities. Not Present- Back Pain, Muscle Pain and Muscle  Weakness. Neurological Present- Headaches. Not Present- Decreased Memory, Fainting, Numbness, Seizures, Tingling, Tremor, Trouble walking and Weakness. Psychiatric Not Present- Anxiety, Bipolar, Change in Sleep Pattern, Depression, Fearful and Frequent crying. Endocrine Present- Cold Intolerance and Hair Changes. Not Present- Excessive Hunger, Heat Intolerance, Hot flashes and New Diabetes. Hematology Present- Easy Bruising. Not Present- Excessive bleeding, Gland problems, HIV and Persistent Infections.  Vitals (Janette Ranson CMA; 03/11/2017 3:07 PM) 03/11/2017 3:07 PM Weight: 173 lb Height: 64in Body Surface Area: 1.84 m Body Mass Index: 29.7 kg/m  Pulse: 84 (Regular)  BP: 130/70 (Sitting, Left Arm, Standard)       Physical Exam Adin Hector, MD; 03/11/2017 3:13 PM) General Mental Status-Alert. General Appearance-Not in acute distress, Not Sickly. Orientation-Oriented X3. Hydration-Well hydrated. Voice-Normal.  Integumentary Global Assessment Normal Exam - Axillae: non-tender, no inflammation or ulceration, no drainage. and Distribution of scalp and body hair is normal. General Characteristics Temperature - normal warmth is noted. Note: No telangiectasias. No port wine stains. No strawberry marks. No hyperpigmented lesions.   Head and Neck Head-normocephalic, atraumatic with no lesions or palpable masses. Face Global Assessment - atraumatic, no absence of expression. Neck Global Assessment - no abnormal movements, no bruit auscultated on the right, no bruit auscultated on the left, no decreased range of motion, non-tender. Trachea-midline. Thyroid Gland Characteristics - non-tender.  Eye Eyeball -  Left-Extraocular movements intact, No Nystagmus. Eyeball - Right-Extraocular movements intact, No Nystagmus. Cornea - Left-No Hazy. Cornea - Right-No Hazy. Sclera/Conjunctiva - Left-No scleral icterus, No Discharge. Sclera/Conjunctiva - Right-No scleral icterus, No Discharge. Pupil - Left-Direct reaction to light normal. Pupil - Right-Direct reaction to light normal.  ENMT Ears Pinna - Left - no drainage observed, no generalized tenderness observed. Right - no drainage observed, no generalized tenderness observed. Nose and Sinuses Nose - no destructive lesion observed. Nares - Left - quiet respiration. Right - quiet respiration. Mouth and Throat Lips - Upper Lip - no fissures observed, no pallor noted. Lower Lip - no fissures observed, no pallor noted. Nasopharynx - no discharge present. Oral Cavity/Oropharynx - Tongue - no dryness observed. Oral Mucosa - no cyanosis observed. Hypopharynx - no evidence of airway distress observed.  Chest and Lung Exam Inspection Movements - Normal and Symmetrical. Accessory muscles - No use of accessory muscles in breathing. Palpation Normal exam - Non-tender. Auscultation Breath sounds - Normal and Clear.  Cardiovascular Auscultation Rhythm - Regular. Murmurs & Other Heart Sounds - Normal exam - No Murmurs and No Systolic Clicks.  Abdomen Inspection Normal Exam - No Visible peristalsis and No Abnormal pulsations. Umbilicus - No Bleeding, No Urine drainage. Palpation/Percussion Normal exam - Soft, Non Tender, No Rebound tenderness, No Rigidity (guarding) and No Cutaneous hyperesthesia. Note: Soft and flat. Mild LEFT posterior flank/LEFT upper quadrant discomfort. Worse on left inferior posterior rib cage. No guarding. Suprapubic region clear. No pain on RIGHT side.   Female Genitourinary Sexual Maturity Tanner 5 - Adult hair pattern. Note: No vaginal bleeding nor discharge   Peripheral Vascular Upper  Extremity Inspection - Left - No Cyanotic nailbeds, Not Ischemic. Right - No Cyanotic nailbeds, Not Ischemic.  Neurologic Neurologic evaluation reveals -normal attention span and ability to concentrate, able to name objects and repeat phrases. Appropriate fund of knowledge , normal sensation and normal coordination. Mental Status Affect - not angry, not paranoid. Cranial Nerves-Normal Bilaterally. Gait-Normal.  Neuropsychiatric Mental status exam performed with findings of-able to articulate well with normal speech/language, rate, volume and coherence, thought content normal with ability to perform  basic computations and apply abstract reasoning and no evidence of hallucinations, delusions, obsessions or homicidal/suicidal ideation.  Musculoskeletal Global Assessment Spine, Ribs and Pelvis - no instability, subluxation or laxity. Right Upper Extremity - no instability, subluxation or laxity. Note: No pain on cervical/thoracic/lumbar/sacral spine.   Lymphatic Head & Neck General Head & Neck Lymphatics: Bilateral - Description - No Localized lymphadenopathy. Axillary General Axillary Region: Bilateral - Description - No Localized lymphadenopathy. Femoral & Inguinal Generalized Femoral & Inguinal Lymphatics: Left: Right - Description - No Localized lymphadenopathy. Description - No Localized lymphadenopathy.    Assessment & Plan Adin Hector MD; 03/11/2017 5:55 PM) SPLENIC MASS (R16.1) Impression: Moderate splenomegaly. Increase in the size and number of enhancing splenic lesions with a greater degree of replacement of the splenic parenchyma over 5 years. Differential diagnostic considerations include splenic hemangiomatosis, Littoral cell angioma, lymphangiomatosis, lymphoma, or metastatic disease.  I have discussed the case with my colleagues, Drs Anda Latina, Kinsinger, & Marlou Starks. Reviewed the films with the patient and her family in the past.  Given the fact that  she's had more lesions over the past decade and she's being having some persistent symptoms of LEFT flank discomfort greater than 2 years despite negative workup by orthopedics, negative urological workup, negative GI workup, no other rheumatological etiology. I'm concerned that observation is not the best course.  I'm concerned that she will continue to get more of these lesions. She is only in her 20s with more time for this to get into further trouble. Her pain has not resolved despite an anti-inflammatory regimen. I don't have any other etiology for pain. She hurts where her spleen is. Therefore, I still again recommend splenectomy for diagnosis/treatment and possible symptom relief.  Would need preoperative immunization - done 2017  She is now ready to consider surgery a 3rd time, still nervous about it. She agrees she's exhaust ed other etiologies in the differential diagnosis.  She says she always has problems with urinary dysfunction every time they put a catheter. Void OCTOR fine Current Plans You are being scheduled for surgery- Our schedulers will call you.  You should hear from our office's scheduling department within 5 working days about the location, date, and time of surgery. We try to make accommodations for patient's preferences in scheduling surgery, but sometimes the OR schedule or the surgeon's schedule prevents [place] from making those accommodations.  If you have not heard from our office [phone]) in 5 working days, call the office and ask for your surgeon's nurse.  If you have other questions about your diagnosis, plan, or surgery, call the office and ask for your surgeon's nurse.  Pt Education - [facility] Free Text Education/Instructions Pt Education - Spleen Diseases: discussed with patient and provided information. Pt Education - [facility] Pain control - tylenol only: discussed with patient and provided information. Pt Education - [facility] Laparosopic Post Op HCI  (Somaly Marteney) IRREGULAR BOWEL HABITS (R19.8) Impression: No strong evidence of obstruction or other abnormality. Perhaps constipation with occasional overflow. Perhaps IBS picture. She is due to see gastroenterology for follow-up colonoscopy for polyps. No progressive symptoms, so most likely not a mechanical issues like a stricture or tumor. Perhaps IBS.  I'm skeptical spleen is causing any obstructive or other symptoms.  I again recommended adding a fiber bowel regimen to see if that would help her out. Consider flaxseed since she seems to have some bloating associated with it. Current Plans Pt Education - [facility] Good Bowel Health (Brooks Stotz)   Signed by Remo Lipps  Gwynneth Aliment, MD (03/11/2017 5:59 PM)

## 2017-03-12 ENCOUNTER — Telehealth (INDEPENDENT_AMBULATORY_CARE_PROVIDER_SITE_OTHER): Payer: Self-pay | Admitting: *Deleted

## 2017-03-12 ENCOUNTER — Encounter (INDEPENDENT_AMBULATORY_CARE_PROVIDER_SITE_OTHER): Payer: Self-pay | Admitting: *Deleted

## 2017-03-12 MED ORDER — PEG-KCL-NACL-NASULF-NA ASC-C 140 G PO SOLR: 1.0000 | Freq: Once | ORAL | 0 refills | Status: AC

## 2017-03-12 NOTE — Telephone Encounter (Signed)
Patient needs plenvu 

## 2017-03-12 NOTE — Telephone Encounter (Signed)
Referring MD/PCP: howatd   Procedure: tcs  Reason/Indication:  Fam hx colon ca, hx polyps  Has patient had this procedure before?  Yes, 2013  If so, when, by whom and where?    Is there a family history of colon cancer?  Yes, paternal grandmother, maternal cousin  Who?  What age when diagnosed?    Is patient diabetic?   no      Does patient have prosthetic heart valve or mechanical valve?  no  Do you have a pacemaker?  no  Has patient ever had endocarditis? no  Has patient had joint replacement within last 12 months?  no  Is patient constipated or take laxatives? no  Does patient have a history of alcohol/drug use?  no  Is patient on Coumadin, Plavix and/or Aspirin? no  Medications: doxycycline 50 mg daily, finasteride 5 mg 1.2 tab daily, zyrtec 10 mg daily, MVI daily  Allergies: nkda  Medication Adjustment per Dr Laural Golden:   Procedure date & time: 04/05/17 at 855

## 2017-03-12 NOTE — Telephone Encounter (Signed)
agree

## 2017-03-13 ENCOUNTER — Other Ambulatory Visit (INDEPENDENT_AMBULATORY_CARE_PROVIDER_SITE_OTHER): Payer: Self-pay | Admitting: *Deleted

## 2017-03-13 DIAGNOSIS — Z800 Family history of malignant neoplasm of digestive organs: Secondary | ICD-10-CM

## 2017-03-13 DIAGNOSIS — Z860100 Personal history of colon polyps, unspecified: Secondary | ICD-10-CM | POA: Insufficient documentation

## 2017-03-22 ENCOUNTER — Ambulatory Visit (INDEPENDENT_AMBULATORY_CARE_PROVIDER_SITE_OTHER): Payer: 59 | Admitting: Adult Health

## 2017-03-22 ENCOUNTER — Ambulatory Visit (HOSPITAL_COMMUNITY)
Admission: RE | Admit: 2017-03-22 | Discharge: 2017-03-22 | Disposition: A | Discharge status: Home / Self Care | Payer: 59 | Source: Ambulatory Visit | Attending: Adult Health | Admitting: Adult Health

## 2017-03-22 ENCOUNTER — Encounter: Payer: Self-pay | Admitting: Adult Health

## 2017-03-22 ENCOUNTER — Other Ambulatory Visit (HOSPITAL_COMMUNITY)
Admission: RE | Admit: 2017-03-22 | Discharge: 2017-03-22 | Disposition: A | Discharge status: Home / Self Care | Payer: 59 | Source: Ambulatory Visit | Attending: Adult Health | Admitting: Adult Health

## 2017-03-22 VITALS — BP 140/82 | HR 71 | Ht 63.5 in | Wt 168.0 lb

## 2017-03-22 DIAGNOSIS — E7800 Pure hypercholesterolemia, unspecified: Secondary | ICD-10-CM | POA: Insufficient documentation

## 2017-03-22 DIAGNOSIS — Z01419 Encounter for gynecological examination (general) (routine) without abnormal findings: Secondary | ICD-10-CM | POA: Insufficient documentation

## 2017-03-22 DIAGNOSIS — Z1231 Encounter for screening mammogram for malignant neoplasm of breast: Secondary | ICD-10-CM

## 2017-03-22 DIAGNOSIS — Z1212 Encounter for screening for malignant neoplasm of rectum: Secondary | ICD-10-CM | POA: Diagnosis not present

## 2017-03-22 DIAGNOSIS — Z01411 Encounter for gynecological examination (general) (routine) with abnormal findings: Secondary | ICD-10-CM | POA: Diagnosis not present

## 2017-03-22 DIAGNOSIS — Z1211 Encounter for screening for malignant neoplasm of colon: Secondary | ICD-10-CM

## 2017-03-22 LAB — HEMOCCULT GUIAC POC 1CARD (OFFICE): FECAL OCCULT BLD: NEGATIVE

## 2017-03-22 NOTE — Progress Notes (Signed)
Patient ID: Holly Golden, female   DOB: [birth year], 47 y.o.   MRN: [id number] History of Present Illness: Lei is a 47 year old white female, married, G2P2, in for well woman gyn exam and pap. PCP is Dr Nadara Mustard.    Current Medications, Allergies, Past Medical History, Past Surgical History, Family History and Social History were reviewed in [employer] record.     Review of Systems:  Patient denies any headaches, hearing loss, fatigue, blurred vision, shortness of breath, chest pain,  problems with bowel movements, urination, or intercourse. No joint pain or mood swings. Has had pain over spleen at times, and is having splenectomy 04/17/17.  Physical Exam:BP 140/82 (BP Location: Left Arm, Patient Position: Sitting, Cuff Size: Normal)   Pulse 71   Ht 5' 3.5" (1.613 m)   Wt 168 lb (76.2 kg)   LMP 03/18/2017   BMI 29.29 kg/m  General:  Well developed, well nourished, no acute distress Skin:  Warm and dry Neck:  Midline trachea, normal thyroid, good ROM, no lymphadenopathy Lungs; Clear to auscultation bilaterally Breast:  No dominant palpable mass, retraction, or nipple discharge Cardiovascular: Regular rate and rhythm Abdomen:  Soft, non tender, no hepatosplenomegaly Pelvic:  External genitalia is normal in appearance, no lesions.  The vagina is normal in appearance. Urethra has no lesions or masses. The cervix is bulbous. Pap with HPV performed. Uterus is felt to be normal size, shape, and contour.  No adnexal masses or tenderness noted.Bladder is non tender, no masses felt. Rectal: Good sphincter tone, no polyps, or hemorrhoids felt.  Hemoccult negative. Extremities/musculoskeletal:  No swelling or varicosities noted, no clubbing or cyanosis Psych:  No mood changes, alert and cooperative,seems happy PHQ 2 score 0.  Impression: 1. Encounter for gynecological examination with Papanicolaou smear of cervix   2. Screening for colorectal cancer   3. Elevated  cholesterol       Plan:  Check CMP and lipids Physical in 1 year Pap in 3 if normal Mammogram yearly, had today

## 2017-03-23 LAB — COMPREHENSIVE METABOLIC PANEL
A/G RATIO: 1.8 (ref 1.2–2.2)
ALT: 12 IU/L (ref 0–32)
AST: 15 IU/L (ref 0–40)
Albumin: 4.8 g/dL (ref 3.5–5.5)
Alkaline Phosphatase: 66 IU/L (ref 39–117)
BUN/Creatinine Ratio: 15 (ref 9–23)
BUN: 12 mg/dL (ref 6–24)
Bilirubin Total: 0.4 mg/dL (ref 0.0–1.2)
CHLORIDE: 104 mmol/L (ref 96–106)
CO2: 19 mmol/L — ABNORMAL LOW (ref 20–29)
Calcium: 9.9 mg/dL (ref 8.7–10.2)
Creatinine, Ser: 0.79 mg/dL (ref 0.57–1.00)
GFR calc Af Amer: 104 mL/min/{1.73_m2} (ref >59)
GFR, EST NON AFRICAN AMERICAN: 90 mL/min/{1.73_m2} (ref >59)
GLUCOSE: 91 mg/dL (ref 65–99)
Globulin, Total: 2.7 g/dL (ref 1.5–4.5)
POTASSIUM: 4.4 mmol/L (ref 3.5–5.2)
Sodium: 141 mmol/L (ref 134–144)
Total Protein: 7.5 g/dL (ref 6.0–8.5)

## 2017-03-23 LAB — LIPID PANEL
CHOL/HDL RATIO: 5.2 ratio — AB (ref 0.0–4.4)
Cholesterol, Total: 160 mg/dL (ref 100–199)
HDL: 31 mg/dL — ABNORMAL LOW (ref >39)
LDL CALC: 74 mg/dL (ref 0–99)
Triglycerides: 276 mg/dL — ABNORMAL HIGH (ref 0–149)
VLDL Cholesterol Cal: 55 mg/dL — ABNORMAL HIGH (ref 5–40)

## 2017-03-26 LAB — CYTOLOGY - PAP
DIAGNOSIS: NEGATIVE
HPV: NOT DETECTED

## 2017-04-01 DIAGNOSIS — I7300 Raynaud's syndrome without gangrene: Secondary | ICD-10-CM | POA: Insufficient documentation

## 2017-04-01 DIAGNOSIS — M19041 Primary osteoarthritis, right hand: Secondary | ICD-10-CM | POA: Insufficient documentation

## 2017-04-01 DIAGNOSIS — M19042 Primary osteoarthritis, left hand: Secondary | ICD-10-CM

## 2017-04-01 NOTE — Progress Notes (Signed)
Office Visit Note  Patient: Holly Golden             Date of Birth: 29-Aug-1970           MRN: [id number]             PCP: Rory Percy, MD Referring: Rory Percy, MD Visit Date: 04/02/2017 Occupation: @GUAROCC @    Subjective:  Hand pain   History of Present Illness: Holly Golden is a 47 y.o. female with history of raynaud's disease and osteoarthritis of hands.  She states she has been taking Norvasc for the past 2.5 weeks, but she has not yet noticed any improvement in her symptoms of Raynaud's.  She states she has been taking her Norvasc in the morning and has been experiencing some dizziness.  She denies any other side effects.  She is having symptoms of Raynaud's in her hands and feet. She states that her bilateral hands continue to cause discomfort.  She denies any joint swelling.    Activities of Daily Living:  Patient reports morning stiffness for 30 minutes.   Patient Denies nocturnal pain.  Difficulty dressing/grooming: Denies Difficulty climbing stairs: Denies Difficulty getting out of chair: Denies Difficulty using hands for taps, buttons, cutlery, and/or writing: Reports   Review of Systems  Constitutional: Negative for fatigue and weakness.  HENT: Negative for mouth sores, mouth dryness and nose dryness.   Eyes: Negative for pain, visual disturbance and dryness.  Respiratory: Negative for cough, hemoptysis, shortness of breath and difficulty breathing.   Cardiovascular: Negative for chest pain, palpitations, hypertension, irregular heartbeat and swelling in legs/feet.  Gastrointestinal: Positive for diarrhea. Negative for blood in stool and constipation.  Endocrine: Negative for increased urination.  Genitourinary: Negative for painful urination.  Musculoskeletal: Positive for arthralgias and joint pain. Negative for joint swelling, myalgias, muscle weakness, morning stiffness, muscle tenderness and myalgias.  Skin: Positive for color change. Negative  for pallor, rash, hair loss, nodules/bumps, redness, skin tightness, ulcers and sensitivity to sunlight.  Allergic/Immunologic: Negative for susceptible to infections.  Neurological: Negative for dizziness, numbness and headaches.  Hematological: Negative for swollen glands.  Psychiatric/Behavioral: Negative for depressed mood and sleep disturbance. The patient is not nervous/anxious.     PMFS History:  Patient Active Problem List   Diagnosis Date Noted  . Raynaud's disease without gangrene 04/01/2017  . Primary osteoarthritis of both hands 04/01/2017  . Screening for colorectal cancer 03/22/2017  . Elevated cholesterol 03/22/2017  . Family history of colon cancer 03/13/2017  . History of colonic polyps 03/13/2017  . 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 LUQ pain 02/15/2015  . Bleeding gums 02/15/2015  . Elevated triglycerides with high cholesterol 07/14/2014  . Hair loss 07/14/2014  . Stress 07/14/2014  . Rectal bleeding 11/26/2013  . Varicose veins of lower extremities with other complications 78/29/5621  . Swelling of hand 11/17/2012  . Breast nodule 08/15/2012  . Splenic disease 01/08/2011    Past Medical History:  Diagnosis Date  . Anxiety   . Bleeding gums 02/15/2015  . Breast nodule 08/15/2012   Left breast nodule at 2 oclock will get mammogram and [place]  . Elevated triglycerides with high cholesterol 07/14/2014  . Fatty liver   . Gall bladder disease   . GERD (gastroesophageal reflux disease)   . Hair loss   . Hemorrhoids   . Joint pain   . Kidney stones   . LUQ pain 02/15/2015  . 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  Raynaud's disease   . Rectal bleeding   . Seasonal allergies   . Stress 07/14/2014  . Swelling    feet or hands  . Swelling of hand 11/17/2012   Has bilateral swelling in hands, and they are cold  . UTI (urinary tract infection)   . Varicose veins     Family History  Problem Relation Age of Onset  . Cancer Maternal Grandmother         pancreatic  . Cancer Paternal Grandmother        breast and colon  . Seizures Mother   . Hyperlipidemia Mother   . Thyroid disease Mother   . Anxiety disorder Mother   . Sleep apnea Mother        no longer on CPAP   . Obesity Mother   . Seizures Brother   . Depression Brother   . Stroke Paternal Grandfather   . Cirrhosis Maternal Grandfather   . Healthy Son   . Diabetes Other   . Healthy Son   . Cancer Cousin        colon, hodgkin's  . Diabetes Cousin    Past Surgical History:  Procedure Laterality Date  . CESAREAN SECTION     1991  . CHOLECYSTECTOMY     2010  . COLONOSCOPY  08/06/2011   Procedure: COLONOSCOPY;  Surgeon: Rogene Houston, MD;  Location: [facility];  Service: Endoscopy;  Laterality: N/A;  730  . ENDOVENOUS ABLATION SAPHENOUS VEIN W/ LASER Left 12-10-2013   EVLA  left small saphenous vein  and stab phlebectomy <10 Incision left leg  . ENDOVENOUS ABLATION SAPHENOUS VEIN W/ LASER Right 12-31-2013   right small saphenous vein  by Curt Jews MD  . left tube and ovary removed for a benign mass.    Marland Kitchen splenic hemangiomas     Social History   Social History Narrative  . Not on file     Objective: Vital Signs: BP 129/83 (BP Location: Left Arm, Patient Position: Sitting, Cuff Size: Normal)   Pulse 77   Resp 14   Ht 5\' 3"  (1.6 m)   Wt 173 lb (78.5 kg)   LMP 03/18/2017   BMI 30.65 kg/m    Physical Exam  Constitutional: She is oriented to person, place, and time. She appears well-developed and well-nourished.  HENT:  Head: Normocephalic and atraumatic.  No parotid swelling  Eyes: Conjunctivae and EOM are normal.  Neck: Normal range of motion.  Cardiovascular: Normal rate, regular rhythm, normal heart sounds and intact distal pulses.  Pulmonary/Chest: Effort normal and breath sounds normal.  Abdominal: Soft. Bowel sounds are normal.  Lymphadenopathy:    She has no cervical adenopathy.  Neurological: She is alert and oriented to person, place, and  time.  Skin: Skin is warm and dry. Capillary refill takes less than 2 seconds.  No digital ulcers No evidence of sclerodactyly   Psychiatric: She has a normal mood and affect. Her behavior is normal.  Nursing note and vitals reviewed.    Musculoskeletal Exam: C-spine, thoracic, and lumbar spine good ROM.  Shoulder joints, elbow joints, wrist joints, MCPs, PIPs, and DIPs good ROM with no synovitis. No evidence of sclerodactyly.  Good capillary refill.  No digital ulcers of fingers or toes. Hip joints, knee joints, ankle joints, MTPs, PIPs, and DIPs good ROM with no synovitis.  No warmth or effusion of knees.  No trochanteric bursitis.  CDAI Exam: No CDAI exam completed.    Investigation: No additional findings. CBC  Latest Ref Rng & Units 02/28/2017 02/15/2015 07/23/2014  WBC 3.8 - 10.8 Thousand/uL 3.4(L) 4.0 4.9  Hemoglobin 11.7 - 15.5 g/dL 14.3 13.6 13.2  Hematocrit 35.0 - 45.0 % 41.9 39.9 38.9  Platelets 140 - 400 Thousand/uL 198 199 200   CMP     Component Value Date/Time   NA 141 03/22/2017 0952   K 4.4 03/22/2017 0952   CL 104 03/22/2017 0952   CO2 19 (L) 03/22/2017 0952   GLUCOSE 91 03/22/2017 0952   GLUCOSE 79 02/28/2017 0947   BUN 12 03/22/2017 0952   CREATININE 0.79 03/22/2017 0952   CREATININE 0.83 02/28/2017 0947   CALCIUM 9.9 03/22/2017 0952   PROT 7.5 03/22/2017 0952   ALBUMIN 4.8 03/22/2017 0952   AST 15 03/22/2017 0952   ALT 12 03/22/2017 0952   ALKPHOS 66 03/22/2017 0952   BILITOT 0.4 03/22/2017 0952   GFRNONAA 90 03/22/2017 0952   GFRNONAA 85 02/28/2017 0947   GFRAA 104 03/22/2017 0952   GFRAA 98 02/28/2017 0947  UA negative, CK 44, TSH normal, SPEP normal, immunoglobulins normal RF negative, anti-CCP negative, ANA negative, ENA negative, anticardiolipin negative, beta 2 negative, lupus anticoagulant negative, C3-C4 normal, ace negative, and ANCA negative, cryoglobulins negative, Hep B negative, hep C negative G6PD normal  Imaging: Mm Screening Breast  Tomo Bilateral  Result Date: 03/22/2017 CLINICAL DATA:  Screening. EXAM: 2D DIGITAL SCREENING BILATERAL MAMMOGRAM WITH 3D TOMO WITH CAD COMPARISON:  Previous exam(s). ACR Breast Density Category b: There are scattered areas of fibroglandular density. FINDINGS: There are no findings suspicious for malignancy. Images were processed with CAD. IMPRESSION: No mammographic evidence of malignancy. A result letter of this screening mammogram will be mailed directly to the patient. RECOMMENDATION: Screening mammogram in one year. (Code:SM-B-01Y) BI-RADS CATEGORY  1: Negative. Electronically Signed   By: Ammie Ferrier M.D.   On: 03/22/2017 09:45    Speciality Comments: No specialty comments available.    Procedures:  No procedures performed Allergies: Patient has no known allergies.      Assessment / Plan:     Visit Diagnoses: Raynaud's disease without gangrene - All autoimmune workup negative.  She continues to have symptoms of Raynaud's in her bilateral hands and feet. She has good capillary refill and no digital ulcers.  She has no evidence of sclerodactyly.  She has not yet noticed an improvement on Norvasc 5 mg.  She was encouraged to try taking Norvasc at bedtime since she has been experiencing some dizziness when taking it in the morning.  She was also encouraged to take Aspirin 81 mg daily to help with increased circulation.  She was encouraged to wear gloves and to look into battery operated gloves.  She should also try keeping her core body temperature warm.  She will follow up in 1 year.  She was advised to call back sooner if she experiences any new or worsening symptoms.  We will continue to monitor her autoimmune labs on a yearly basis.    Primary osteoarthritis of both hands - Bilateral mild: She continues to have discomfort in bilateral hands.  No synovitis on exam.    Hair loss: She is taking Biotin.    Elevated cholesterol    Orders: No orders of the defined types were placed in  this encounter.  No orders of the defined types were placed in this encounter.   Face-to-face time spent with patient was  minutes. 50% of time was spent in counseling and coordination of care.  Follow-Up Instructions:  Return in about 1 year (around 04/02/2018) for Raynaud's disease, osteoarthritis .   Ofilia Neas, PA-C I examined and evaluated the patient with Hazel Sams PA. The plan of care was discussed as noted above.  Bo Merino, MD Note - This record has been created using Editor, commissioning.  Chart creation errors have been sought, but may not always  have been located. Such creation errors do not reflect on  the standard of medical care.

## 2017-04-02 ENCOUNTER — Encounter: Payer: Self-pay | Admitting: Rheumatology

## 2017-04-02 ENCOUNTER — Ambulatory Visit (INDEPENDENT_AMBULATORY_CARE_PROVIDER_SITE_OTHER): Payer: 59 | Admitting: Rheumatology

## 2017-04-02 VITALS — BP 129/83 | HR 77 | Resp 14 | Ht 63.0 in | Wt 173.0 lb

## 2017-04-02 DIAGNOSIS — E7800 Pure hypercholesterolemia, unspecified: Secondary | ICD-10-CM | POA: Diagnosis not present

## 2017-04-02 DIAGNOSIS — I7300 Raynaud's syndrome without gangrene: Secondary | ICD-10-CM | POA: Diagnosis not present

## 2017-04-02 DIAGNOSIS — M19041 Primary osteoarthritis, right hand: Secondary | ICD-10-CM | POA: Diagnosis not present

## 2017-04-02 DIAGNOSIS — M19042 Primary osteoarthritis, left hand: Secondary | ICD-10-CM

## 2017-04-02 DIAGNOSIS — L659 Nonscarring hair loss, unspecified: Secondary | ICD-10-CM | POA: Diagnosis not present

## 2017-04-05 ENCOUNTER — Ambulatory Visit (HOSPITAL_COMMUNITY)
Admission: RE | Admit: 2017-04-05 | Discharge: 2017-04-05 | Disposition: A | Discharge status: Home / Self Care | Payer: 59 | Source: Ambulatory Visit | Attending: Internal Medicine | Admitting: Internal Medicine

## 2017-04-05 ENCOUNTER — Encounter (HOSPITAL_COMMUNITY)
Admission: RE | Disposition: A | Discharge status: Home / Self Care | Payer: Self-pay | Source: Ambulatory Visit | Attending: Internal Medicine

## 2017-04-05 ENCOUNTER — Encounter (HOSPITAL_COMMUNITY): Payer: Self-pay | Admitting: *Deleted

## 2017-04-05 ENCOUNTER — Other Ambulatory Visit: Payer: Self-pay

## 2017-04-05 ENCOUNTER — Ambulatory Visit (HOSPITAL_COMMUNITY): Payer: 59

## 2017-04-05 DIAGNOSIS — I8390 Asymptomatic varicose veins of unspecified lower extremity: Secondary | ICD-10-CM | POA: Diagnosis not present

## 2017-04-05 DIAGNOSIS — K648 Other hemorrhoids: Secondary | ICD-10-CM | POA: Diagnosis not present

## 2017-04-05 DIAGNOSIS — K760 Fatty (change of) liver, not elsewhere classified: Secondary | ICD-10-CM | POA: Diagnosis not present

## 2017-04-05 DIAGNOSIS — K219 Gastro-esophageal reflux disease without esophagitis: Secondary | ICD-10-CM | POA: Diagnosis not present

## 2017-04-05 DIAGNOSIS — Z1211 Encounter for screening for malignant neoplasm of colon: Secondary | ICD-10-CM | POA: Diagnosis not present

## 2017-04-05 DIAGNOSIS — Z87442 Personal history of urinary calculi: Secondary | ICD-10-CM | POA: Diagnosis not present

## 2017-04-05 DIAGNOSIS — Z860100 Personal history of colon polyps, unspecified: Secondary | ICD-10-CM | POA: Insufficient documentation

## 2017-04-05 DIAGNOSIS — Z833 Family history of diabetes mellitus: Secondary | ICD-10-CM | POA: Insufficient documentation

## 2017-04-05 DIAGNOSIS — Z79899 Other long term (current) drug therapy: Secondary | ICD-10-CM | POA: Insufficient documentation

## 2017-04-05 DIAGNOSIS — Z8349 Family history of other endocrine, nutritional and metabolic diseases: Secondary | ICD-10-CM | POA: Insufficient documentation

## 2017-04-05 DIAGNOSIS — N6320 Unspecified lump in the left breast, unspecified quadrant: Secondary | ICD-10-CM | POA: Diagnosis not present

## 2017-04-05 DIAGNOSIS — E7800 Pure hypercholesterolemia, unspecified: Secondary | ICD-10-CM | POA: Diagnosis not present

## 2017-04-05 DIAGNOSIS — L659 Nonscarring hair loss, unspecified: Secondary | ICD-10-CM | POA: Diagnosis not present

## 2017-04-05 DIAGNOSIS — Z8719 Personal history of other diseases of the digestive system: Secondary | ICD-10-CM | POA: Insufficient documentation

## 2017-04-05 DIAGNOSIS — Z818 Family history of other mental and behavioral disorders: Secondary | ICD-10-CM | POA: Diagnosis not present

## 2017-04-05 DIAGNOSIS — E781 Pure hyperglyceridemia: Secondary | ICD-10-CM | POA: Insufficient documentation

## 2017-04-05 DIAGNOSIS — Z9049 Acquired absence of other specified parts of digestive tract: Secondary | ICD-10-CM | POA: Insufficient documentation

## 2017-04-05 DIAGNOSIS — Z808 Family history of malignant neoplasm of other organs or systems: Secondary | ICD-10-CM | POA: Diagnosis not present

## 2017-04-05 DIAGNOSIS — Z823 Family history of stroke: Secondary | ICD-10-CM | POA: Insufficient documentation

## 2017-04-05 DIAGNOSIS — Z87440 Personal history of urinary (tract) infections: Secondary | ICD-10-CM | POA: Diagnosis not present

## 2017-04-05 DIAGNOSIS — Z09 Encounter for follow-up examination after completed treatment for conditions other than malignant neoplasm: Secondary | ICD-10-CM | POA: Diagnosis not present

## 2017-04-05 DIAGNOSIS — Z803 Family history of malignant neoplasm of breast: Secondary | ICD-10-CM | POA: Diagnosis not present

## 2017-04-05 DIAGNOSIS — Z836 Family history of other diseases of the respiratory system: Secondary | ICD-10-CM | POA: Insufficient documentation

## 2017-04-05 DIAGNOSIS — Z800 Family history of malignant neoplasm of digestive organs: Secondary | ICD-10-CM | POA: Insufficient documentation

## 2017-04-05 DIAGNOSIS — I7300 Raynaud's syndrome without gangrene: Secondary | ICD-10-CM | POA: Insufficient documentation

## 2017-04-05 HISTORY — DX: Personal history of urinary calculi: Z87.442

## 2017-04-05 HISTORY — DX: Nausea with vomiting, unspecified: R11.2

## 2017-04-05 HISTORY — PX: COLONOSCOPY: SHX5424

## 2017-04-05 HISTORY — DX: Other specified postprocedural states: Z98.890

## 2017-04-05 SURGERY — COLONOSCOPY
Anesthesia: Moderate Sedation

## 2017-04-05 MED ORDER — MEPERIDINE HCL 50 MG/ML IJ SOLN
INTRAMUSCULAR | Status: DC | PRN
Start: 2017-04-05 — End: 2017-04-05
  Administered 2017-04-05 (×2): 25 mg via INTRAVENOUS

## 2017-04-05 MED ORDER — MIDAZOLAM HCL 5 MG/5ML IJ SOLN
INTRAMUSCULAR | Status: AC
  Filled 2017-04-05: qty 10

## 2017-04-05 MED ORDER — SODIUM CHLORIDE 0.9 % IV SOLN
INTRAVENOUS | Status: DC
  Administered 2017-04-05: 1000 mL via INTRAVENOUS

## 2017-04-05 MED ORDER — MIDAZOLAM HCL 5 MG/5ML IJ SOLN
INTRAMUSCULAR | Status: DC | PRN
  Administered 2017-04-05 (×5): 2 mg via INTRAVENOUS

## 2017-04-05 MED ORDER — STERILE WATER FOR IRRIGATION IR SOLN
Status: DC | PRN
  Administered 2017-04-05: 10:00:00

## 2017-04-05 MED ORDER — MEPERIDINE HCL 50 MG/ML IJ SOLN
INTRAMUSCULAR | Status: AC
  Filled 2017-04-05: qty 1

## 2017-04-05 MED ORDER — BENEFIBER DRINK MIX PO PACK: 4.0000 g | PACK | Freq: Every day | ORAL | Status: DC

## 2017-04-05 NOTE — Op Note (Signed)
[facility] Patient Name: Holly Golden Procedure Date: 04/05/2017 9:10 AM MRN: [id number] Date of Birth: November 04, 1970 Attending MD: Hildred Laser , MD CSN: [id number] Age: 47 Admit Type: Outpatient Procedure:                Colonoscopy Indications:              High risk colon cancer surveillance: Personal                            history of colonic polyps, Family history of colon                            cancer in multiple second-degree relatives Providers:                Hildred Laser, MD, Otis Peak B. Sharon Seller, RN, Randa Spike, Technician Referring MD:             Crissie Sickles. Nadara Mustard, MD Medicines:                Meperidine 50 mg IV, Midazolam 10 mg IV Complications:            No immediate complications. Estimated Blood Loss:     Estimated blood loss: none. Procedure:                Pre-Anesthesia Assessment:                           - Prior to the procedure, a History and Physical                            was performed, and patient medications and                            allergies were reviewed. The patient's tolerance of                            previous anesthesia was also reviewed. The risks                            and benefits of the procedure and the sedation                            options and risks were discussed with the patient.                            All questions were answered, and informed consent                            was obtained. Prior Anticoagulants: The patient                            last took ibuprofen 5 days prior to the procedure.  ASA Grade Assessment: II - A patient with mild                            systemic disease. After reviewing the risks and                            benefits, the patient was deemed in satisfactory                            condition to undergo the procedure.                           After obtaining informed consent, the colonoscope                             was passed under direct vision. Throughout the                            procedure, the patient's blood pressure, pulse, and                            oxygen saturations were monitored continuously. The                            [id number] ([id number]) was introduced through the                            anus and advanced to the the cecum, identified by                            appendiceal orifice and ileocecal valve. The                            colonoscopy was somewhat difficult. The patient                            tolerated the procedure well. The quality of the                            bowel preparation was good. The ileocecal valve,                            appendiceal orifice, and rectum were photographed. Scope In: 9:56:36 AM Scope Out: 10:19:58 AM Scope Withdrawal Time: 0 hours 10 minutes 51 seconds  Total Procedure Duration: 0 hours 23 minutes 22 seconds  Findings:      The perianal and digital rectal examinations were normal.      The colon (entire examined portion) appeared normal.      Internal hemorrhoids were found during retroflexion. The hemorrhoids       were medium-sized. Impression:               - The entire examined colon is normal.                           -  Internal hemorrhoids.                           - No specimens collected. Moderate Sedation:      Moderate (conscious) sedation was administered by the endoscopy nurse       and supervised by the endoscopist. The following parameters were       monitored: oxygen saturation, heart rate, blood pressure, CO2       capnography and response to care. Total physician intraservice time was       30 minutes. Recommendation:           - Patient has a contact number available for                            emergencies. The signs and symptoms of potential                            delayed complications were discussed with the                            patient. Return to normal activities tomorrow.                             Written discharge instructions were provided to the                            patient.                           - High fiber diet today.                           - Continue present medications.                           - Benefiber 4 gram po qhs.                           - Repeat colonoscopy in 5 years for surveillance. Procedure Code(s):        --- Professional ---                           [phone], Colonoscopy, flexible; diagnostic, including                            collection of specimen(s) by brushing or washing,                            when performed (separate procedure)                           99152, Moderate sedation services provided by the                            same physician or other qualified health care  professional performing the diagnostic or                            therapeutic service that the sedation supports,                            requiring the presence of an independent trained                            observer to assist in the monitoring of the                            patient's level of consciousness and physiological                            status; initial 15 minutes of intraservice time,                            patient age 38 years or older                           [phone], Moderate sedation services; each additional                            15 minutes intraservice time Diagnosis Code(s):        --- Professional ---                           Z86.010, Personal history of colonic polyps                           K64.8, Other hemorrhoids                           Z80.0, Family history of malignant neoplasm of                            digestive organs CPT copyright 2016 [employer]. All rights reserved. The codes documented in this report are preliminary and upon coder review may  be revised to meet current compliance requirements. Hildred Laser, MD Hildred Laser, MD 04/05/2017 10:28:08 AM This report has been signed electronically. Number of Addenda: 0

## 2017-04-05 NOTE — H&P (Signed)
Holly Golden is an 47 y.o. female.   Chief Complaint: Patient is here for colonoscopy. HPI: Patient is a 47 year old Caucasian female who has a history of colonic adenoma and is here for surveillance colonoscopy.  Her last exam was in June 2013 with removal of 2 polyps.  These were small.  One polyp was tubular adenoma.  She has irregular bowel movements.  She either has diarrhea and/or constipation and is felt to have IBS.  She also has hemorrhoids and intermittent hematochezia. Family history significant for CRC in maternal grandmother and first cousin who died in her 44s because of CRC.  Past Medical History:  Diagnosis Date  .    .  02/15/2015  . Breast nodule 08/15/2012   Left breast nodule at 2 oclock will get mammogram and [place]  . Elevated triglycerides with high cholesterol 07/14/2014  . Fatty liver   .    Marland Kitchen GERD (gastroesophageal reflux disease)   . Hair loss   . Hemorrhoids   . History of kidney stones   .    .    .  02/15/2015  . 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 PONV (postoperative nausea and vomiting)   . Raynaud's disease   .    .    .  07/14/2014  .       Marland Kitchen  11/17/2012     . UTI (urinary tract infection)   . Varicose veins     Past Surgical History:  Procedure Laterality Date  . CESAREAN SECTION     1991  . CHOLECYSTECTOMY     2010  . COLONOSCOPY  08/06/2011   Procedure: COLONOSCOPY;  Surgeon: Rogene Houston, MD;  Location: [facility];  Service: Endoscopy;  Laterality: N/A;  730  . ENDOVENOUS ABLATION SAPHENOUS VEIN W/ LASER Left 12-10-2013   EVLA  left small saphenous vein  and stab phlebectomy <10 Incision left leg  . ENDOVENOUS ABLATION SAPHENOUS VEIN W/ LASER Right 12-31-2013   right small saphenous vein  by Curt Jews MD  . left tube and ovary removed for a benign mass.    Marland Kitchen splenic hemangiomas      Family History  Problem Relation Age of Onset  . Cancer Maternal Grandmother        pancreatic  . Cancer Paternal Grandmother         breast and colon  . Seizures Mother   . Hyperlipidemia Mother   . Thyroid disease Mother   . Anxiety disorder Mother   . Sleep apnea Mother        no longer on CPAP   . Obesity Mother   . Seizures Brother   . Depression Brother   . Stroke Paternal Grandfather   . Cirrhosis Maternal Grandfather   . Healthy Son   . Diabetes Other   . Healthy Son   . Cancer Cousin        colon, hodgkin's  . Diabetes Cousin    Social History:  reports that  has never smoked. she has never used smokeless tobacco. She reports that she does not drink alcohol or use drugs.  Allergies: No Known Allergies  Medications Prior to Admission  Medication Sig Dispense Refill  . amLODipine (NORVASC) 5 MG tablet Take 1 tablet (5 mg total) by mouth daily. 30 tablet 2  . cetirizine (ZYRTEC) 10 MG tablet Take 10 mg by mouth daily.    Marland Kitchen doxycycline (VIBRAMYCIN) 50 MG capsule Take 50 mg  by mouth daily.    . finasteride (PROSCAR) 5 MG tablet Take 2.5 mg by mouth daily.  5  . ibuprofen (ADVIL,MOTRIN) 200 MG tablet Take 600 mg by mouth every 6 (six) hours as needed for headache or moderate pain.     . Biotin w/ Vitamins C & E (HAIR SKIN & NAILS GUMMIES PO) Take 3 each by mouth daily.    . fluticasone (FLONASE) 50 MCG/ACT nasal spray Place 2 sprays into both nostrils daily as needed for allergies or rhinitis.    Marland Kitchen metroNIDAZOLE (METROGEL) 0.75 % gel Apply 1 application topically 2 (two) times daily.      No results found for this or any previous visit (from the past 48 hour(s)). No results found.  ROS  Blood pressure 122/74, pulse 79, temperature 98 F (36.7 C), temperature source Oral, resp. rate 13, height 5\' 3"  (1.6 m), weight 165 lb (74.8 kg), last menstrual period 03/18/2017, SpO2 100 %. Physical Exam  Constitutional: She appears well-developed and well-nourished.  HENT:  Mouth/Throat: Oropharynx is clear and moist.  Eyes: Conjunctivae are normal. No scleral icterus.  Neck: No thyromegaly present.   Cardiovascular: Normal rate, regular rhythm and normal heart sounds.  No murmur heard. Respiratory: Effort normal and breath sounds normal.  GI: Soft. She exhibits no distension and no mass. There is no tenderness.  Musculoskeletal: She exhibits no edema.  Lymphadenopathy:    She has no cervical adenopathy.  Neurological: She is alert.  Skin: Skin is warm and dry.     Assessment/Plan History of colonic adenoma. Family history of CRC in 2 second-degree relatives. Surveillance colonoscopy.  Hildred Laser, MD 04/05/2017, 9:43 AM

## 2017-04-05 NOTE — Discharge Instructions (Signed)
Hemorrhoids °Hemorrhoids are swollen veins in and around the rectum or anus. There are two types of hemorrhoids: °· Internal hemorrhoids. These occur in the veins that are just inside the rectum. They may poke through to the outside and become irritated and painful. °· External hemorrhoids. These occur in the veins that are outside of the anus and can be felt as a painful swelling or hard lump near the anus. ° °Most hemorrhoids do not cause serious problems, and they can be managed with home treatments such as diet and lifestyle changes. If home treatments do not help your symptoms, procedures can be done to shrink or remove the hemorrhoids. °What are the causes? °This condition is caused by increased pressure in the anal area. This pressure may result from various things, including: °· Constipation. °· Straining to have a bowel movement. °· Diarrhea. °· Pregnancy. °· Obesity. °· Sitting for [place] periods of time. °· Heavy lifting or other activity that causes you to strain. °· Anal sex. ° °What are the signs or symptoms? °Symptoms of this condition include: °· Pain. °· Anal itching or irritation. °· Rectal bleeding. °· Leakage of stool (feces). °· Anal swelling. °· One or more lumps around the anus. ° °How is this diagnosed? °This condition can often be diagnosed through a visual exam. Other exams or tests may also be done, such as: °· Examination of the rectal area with a gloved hand (digital rectal exam). °· Examination of the anal canal using a small tube (anoscope). °· A blood test, if you have lost a significant amount of blood. °· A test to look inside the colon (sigmoidoscopy or colonoscopy). ° °How is this treated? °This condition can usually be treated at home. However, various procedures may be done if dietary changes, lifestyle changes, and other home treatments do not help your symptoms. These procedures can help make the hemorrhoids smaller or remove them completely. Some of these procedures involve  surgery, and others do not. Common procedures include: °· Rubber band ligation. Rubber bands are placed at the base of the hemorrhoids to cut off the blood supply to them. °· Sclerotherapy. Medicine is injected into the hemorrhoids to shrink them. °· Infrared coagulation. A type of light energy is used to get rid of the hemorrhoids. °· Hemorrhoidectomy surgery. The hemorrhoids are surgically removed, and the veins that supply them are tied off. °· Stapled hemorrhoidopexy surgery. A circular stapling device is used to remove the hemorrhoids and use staples to cut off the blood supply to them. ° °Follow these instructions at home: °Eating and drinking °· Eat foods that have a lot of fiber in them, such as whole grains, beans, nuts, fruits, and vegetables. Ask your health care provider about taking products that have added fiber (fiber supplements). °· Drink enough fluid to keep your urine clear or pale yellow. °Managing pain and swelling °· Take warm sitz baths for 20 minutes, 3-4 times a day to ease pain and discomfort. °· If directed, apply ice to the affected area. Using ice packs between sitz baths may be helpful. °? Put ice in a plastic bag. °? Place a towel between your skin and the bag. °? Leave the ice on for 20 minutes, 2-3 times a day. °General instructions °· Take over-the-counter and prescription medicines only as told by your health care provider. °· Use medicated creams or suppositories as told. °· Exercise regularly. °· Go to the bathroom when you have the urge to have a bowel movement. Do not wait. °·   Avoid straining to have bowel movements.  Keep the anal area dry and clean. Use wet toilet paper or moist towelettes after a bowel movement.  Do not sit on the toilet for [place] periods of time. This increases blood pooling and pain. Contact a health care provider if:  You have increasing pain and swelling that are not controlled by treatment or medicine.  You have uncontrolled bleeding.  You  have difficulty having a bowel movement, or you are unable to have a bowel movement.  You have pain or inflammation outside the area of the hemorrhoids. This information is not intended to replace advice given to you by your health care provider. Make sure you discuss any questions you have with your health care provider. Document Released: 02/17/2000 Document Revised: 07/20/2015 Document Reviewed: 11/03/2014 Elsevier Interactive Patient Education  2018 [employer]. Colonoscopy, Adult, Care After This sheet gives you information about how to care for yourself after your procedure. Your health care provider may also give you more specific instructions. If you have problems or questions, contact your health care provider. What can I expect after the procedure? After the procedure, it is common to have:  A small amount of blood in your stool for 24 hours after the procedure.  Some gas.  Mild abdominal cramping or bloating.  Follow these instructions at home: General instructions   For the first 24 hours after the procedure: ? Do not drive or use machinery. ? Do not sign important documents. ? Do not drink alcohol. ? Do your regular daily activities at a slower pace than normal. ? Eat soft, easy-to-digest foods. ? Rest often.  Take over-the-counter or prescription medicines only as told by your health care provider.  It is up to you to get the results of your procedure. Ask your health care provider, or the department performing the procedure, when your results will be ready. Relieving cramping and bloating  Try walking around when you have cramps or feel bloated.  Apply heat to your abdomen as told by your health care provider. Use a heat source that your health care provider recommends, such as a moist heat pack or a heating pad. ? Place a towel between your skin and the heat source. ? Leave the heat on for 20-30 minutes. ? Remove the heat if your skin turns bright red. This is  especially important if you are unable to feel pain, heat, or cold. You may have a greater risk of getting burned. Eating and drinking  Drink enough fluid to keep your urine clear or pale yellow.  Resume your normal diet as instructed by your health care provider. Avoid heavy or fried foods that are hard to digest.  Avoid drinking alcohol for as [place] as instructed by your health care provider. Contact a health care provider if:  You have blood in your stool 2-3 days after the procedure. Get help right away if:  You have more than a small spotting of blood in your stool.  You pass large blood clots in your stool.  Your abdomen is swollen.  You have nausea or vomiting.  You have a fever.  You have increasing abdominal pain that is not relieved with medicine. This information is not intended to replace advice given to you by your health care provider. Make sure you discuss any questions you have with your health care provider. Document Released: 10/04/2003 Document Revised: 11/14/2015 Document Reviewed: 05/03/2015 Elsevier Interactive Patient Education  2018 [place] usual medications and high  fiber diet. Benefiber 4 g p.o. Nightly. No driving for 24 hours. Next colonoscopy in 5 years.

## 2017-04-09 ENCOUNTER — Encounter (HOSPITAL_COMMUNITY): Payer: Self-pay | Admitting: Internal Medicine

## 2017-04-11 ENCOUNTER — Other Ambulatory Visit (HOSPITAL_COMMUNITY): Payer: Self-pay | Admitting: *Deleted

## 2017-04-11 NOTE — Patient Instructions (Addendum)
Holly Golden  04/11/2017   Your procedure is scheduled on: Wednesday 04-17-17  Report to [facility] Main  Entrance  Report to admitting at 630 AM  Call this number if you have problems the morning of surgery [phone]   Remember: Do not eat food or drink liquids :After Midnight.     Take these medicines the morning of surgery with A SIP OF WATER:AMLODIPINE ([place]), CERTRIZINE  (ZYRTEC)                              You may not have any metal on your body including hair pins and              piercings  Do not wear jewelry, make-up, lotions, powders or perfumes, deodorant             Do not wear nail polish.  Do not shave  48 hours prior to surgery.               Do not bring valuables to the hospital. [place].  Contacts, dentures or bridgework may not be worn into surgery.  Leave suitcase in the car. After surgery it may be brought to your room.                  Please read over the following fact sheets you were given: _____________________________________________________________________             DRINK 2 PRESURGERY ENSURE DRINKS THE NIGHT BEFORE SURGERY AT  1000 PM AND 1 PRESURGERY DRINK THE DAY OF THE PROCEDURE 3 HOURS PRIOR TO SCHEDULED SURGERY. NO SOLINDS AFTER MIDNIGHT THE DAY PRIOR TO THE SURGERY. NOTHING BY MOUTH EXCEPT CLEAR LIQUIDS UNTIL THREE HOURS PRIOR TO SCHEDULED SURGERY. PLEASE FINISH PRESURGERY ENSURE DRINK PER SURGEON ORDER 3 HOURS PRIOR TO SCHEDULED SURGERY TIME WHICH NEEDS TO BE COMPLETED AT 530 AM    CLEAR LIQUID DIET   Foods Allowed                                                                     Foods Excluded  Coffee and tea, regular and decaf                             liquids that you cannot  Plain Jell-O in any flavor                                             see through such as: Fruit ices (not with fruit pulp)                                     milk, soups,  orange juice  Iced Popsicles  All solid food Carbonated beverages, regular and diet                                    Cranberry, grape and apple juices Sports drinks like Gatorade Lightly seasoned clear broth or consume(fat free) Sugar, honey syrup  Sample Menu Breakfast                                Lunch                                     Supper Cranberry juice                    Beef broth                            Chicken broth Jell-O                                     Grape juice                           Apple juice Coffee or tea                        Jell-O                                      Popsicle                                                Coffee or tea                        Coffee or tea  _____________________________________________________________________  [facility] - Preparing for Surgery Before surgery, you can play an important role.  Because skin is not sterile, your skin needs to be as free of germs as possible.  You can reduce the number of germs on your skin by washing with CHG (chlorahexidine gluconate) soap before surgery.  CHG is an antiseptic cleaner which kills germs and bonds with the skin to continue killing germs even after washing. Please DO NOT use if you have an allergy to CHG or antibacterial soaps.  If your skin becomes reddened/irritated stop using the CHG and inform your nurse when you arrive at Short Stay. Do not shave (including legs and underarms) for at least 48 hours prior to the first CHG shower.  You may shave your face/neck. Please follow these instructions carefully:  1.  Shower with CHG Soap the night before surgery and the  morning of Surgery.  2.  If you choose to wash your hair, wash your hair first as usual with your  normal  shampoo.  3.  After you shampoo, rinse your hair and body thoroughly to remove the  shampoo.  4.  Use CHG as you would any other liquid soap.  You  can apply chg directly  to the skin and wash                       Gently with a scrungie or clean washcloth.  5.  Apply the CHG Soap to your body ONLY FROM THE NECK DOWN.   Do not use on face/ open                           Wound or open sores. Avoid contact with eyes, ears mouth and genitals (private parts).                       Wash face,  Genitals (private parts) with your normal soap.             6.  Wash thoroughly, paying special attention to the area where your surgery  will be performed.  7.  Thoroughly rinse your body with warm water from the neck down.  8.  DO NOT shower/wash with your normal soap after using and rinsing off  the CHG Soap.                9.  Pat yourself dry with a clean towel.            10.  Wear clean pajamas.            11.  Place clean sheets on your bed the night of your first shower and do not  sleep with pets. Day of Surgery : Do not apply any lotions/deodorants the morning of surgery.  Please wear clean clothes to the hospital/surgery center.  FAILURE TO FOLLOW THESE INSTRUCTIONS MAY RESULT IN THE CANCELLATION OF YOUR SURGERY PATIENT SIGNATURE_________________________________  NURSE SIGNATURE__________________________________  ________________________________________________________________________

## 2017-04-12 ENCOUNTER — Encounter (HOSPITAL_COMMUNITY): Payer: Self-pay

## 2017-04-12 ENCOUNTER — Encounter (HOSPITAL_COMMUNITY)
Admission: RE | Admit: 2017-04-12 | Discharge: 2017-04-12 | Disposition: A | Discharge status: Home / Self Care | Payer: 59 | Source: Ambulatory Visit | Attending: Surgery | Admitting: Surgery

## 2017-04-12 ENCOUNTER — Other Ambulatory Visit: Payer: Self-pay

## 2017-04-12 DIAGNOSIS — Z01818 Encounter for other preprocedural examination: Secondary | ICD-10-CM | POA: Insufficient documentation

## 2017-04-12 DIAGNOSIS — D7389 Other diseases of spleen: Secondary | ICD-10-CM | POA: Diagnosis not present

## 2017-04-12 DIAGNOSIS — I10 Essential (primary) hypertension: Secondary | ICD-10-CM | POA: Diagnosis not present

## 2017-04-12 HISTORY — DX: Headache: R51

## 2017-04-12 HISTORY — DX: Headache, unspecified: R51.9

## 2017-04-12 HISTORY — DX: Unspecified osteoarthritis, unspecified site: M19.90

## 2017-04-12 LAB — BASIC METABOLIC PANEL
ANION GAP: 6 (ref 5–15)
BUN: 11 mg/dL (ref 6–20)
CALCIUM: 9.2 mg/dL (ref 8.9–10.3)
CO2: 25 mmol/L (ref 22–32)
Chloride: 108 mmol/L (ref 101–111)
Creatinine, Ser: 0.68 mg/dL (ref 0.44–1.00)
Glucose, Bld: 94 mg/dL (ref 65–99)
Potassium: 4.1 mmol/L (ref 3.5–5.1)
Sodium: 139 mmol/L (ref 135–145)

## 2017-04-12 LAB — CBC WITH DIFFERENTIAL/PLATELET
BASOS ABS: 0 10*3/uL (ref 0.0–0.1)
BASOS PCT: 0 %
Eosinophils Absolute: 0.1 10*3/uL (ref 0.0–0.7)
Eosinophils Relative: 1 %
HCT: 39.5 % (ref 36.0–46.0)
HEMOGLOBIN: 13.6 g/dL (ref 12.0–15.0)
Lymphocytes Relative: 37 %
Lymphs Abs: 1.6 10*3/uL (ref 0.7–4.0)
MCH: 29 pg (ref 26.0–34.0)
MCHC: 34.4 g/dL (ref 30.0–36.0)
MCV: 84.2 fL (ref 78.0–100.0)
Monocytes Absolute: 0.2 10*3/uL (ref 0.1–1.0)
Monocytes Relative: 5 %
NEUTROS ABS: 2.5 10*3/uL (ref 1.7–7.7)
NEUTROS PCT: 57 %
Platelets: 186 10*3/uL (ref 150–400)
RBC: 4.69 MIL/uL (ref 3.87–5.11)
RDW: 13.4 % (ref 11.5–15.5)
WBC: 4.4 10*3/uL (ref 4.0–10.5)

## 2017-04-12 LAB — HCG, SERUM, QUALITATIVE: PREG SERUM: NEGATIVE

## 2017-04-13 LAB — ABO/RH: ABO/RH(D): A POS

## 2017-04-16 ENCOUNTER — Encounter (HOSPITAL_COMMUNITY): Payer: Self-pay | Admitting: Anesthesiology

## 2017-04-16 MED ORDER — BUPIVACAINE LIPOSOME 1.3 % IJ SUSP
20.0000 mL | INTRAMUSCULAR | Status: DC
  Filled 2017-04-16: qty 20

## 2017-04-16 NOTE — Anesthesia Preprocedure Evaluation (Addendum)
Anesthesia Evaluation  Patient identified by MRN, date of birth, ID band Patient awake    Reviewed: Allergy & Precautions, NPO status , Patient's Chart, lab work & pertinent test results  History of Anesthesia Complications (+) PONV and history of anesthetic complications  Airway Mallampati: II  TM Distance: >3 FB Neck ROM: Full    Dental no notable dental hx. 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 mulltiple splenic masses Hx/o Raynaud's disease   Neuro/Psych  Headaches, negative psych ROS   GI/Hepatic GERD  Medicated and Controlled,Hepatic steatosis   Endo/Other  Hyperlipidemic  Renal/GU Hx/o renal calculi   negative genitourinary   Musculoskeletal  (+) Arthritis , Osteoarthritis,  OA both hands    Abdominal   Peds  Hematology Splenic nodules    Anesthesia Other Findings   Reproductive/Obstetrics                            Anesthesia Physical Anesthesia Plan  ASA: II  Anesthesia Plan: General   Post-op Pain Management:    Induction:   PONV Risk Score and Plan: Scopolamine patch - Pre-op, Midazolam, Dexamethasone, Ondansetron and Treatment may vary due to age or medical condition  Airway Management Planned: Oral ETT  Additional Equipment:   Intra-op Plan:   Post-operative Plan: Extubation in OR  Informed Consent: I have reviewed the patients History and Physical, chart, labs and discussed the procedure including the risks, benefits and alternatives for the proposed anesthesia with the patient or authorized representative who has indicated his/her understanding and acceptance.   Dental advisory given  Plan Discussed with: CRNA, Anesthesiologist and  Surgeon  Anesthesia Plan Comments:        Anesthesia Quick Evaluation

## 2017-04-17 ENCOUNTER — Inpatient Hospital Stay (HOSPITAL_COMMUNITY): Payer: 59 | Admitting: Anesthesiology

## 2017-04-17 ENCOUNTER — Encounter (HOSPITAL_COMMUNITY): Payer: Self-pay | Admitting: Anesthesiology

## 2017-04-17 ENCOUNTER — Inpatient Hospital Stay (HOSPITAL_COMMUNITY)
Admission: RE | Admit: 2017-04-17 | Discharge: 2017-04-18 | DRG: 358 | Disposition: A | Discharge status: Home / Self Care | Payer: 59 | Source: Ambulatory Visit | Attending: Surgery | Admitting: Surgery

## 2017-04-17 ENCOUNTER — Other Ambulatory Visit: Payer: Self-pay

## 2017-04-17 ENCOUNTER — Encounter (HOSPITAL_COMMUNITY)
Admission: RE | Disposition: A | Discharge status: Home / Self Care | Payer: Self-pay | Source: Ambulatory Visit | Attending: Surgery

## 2017-04-17 DIAGNOSIS — G8929 Other chronic pain: Secondary | ICD-10-CM | POA: Diagnosis present

## 2017-04-17 DIAGNOSIS — D1803 Hemangioma of intra-abdominal structures: Principal | ICD-10-CM | POA: Diagnosis present

## 2017-04-17 DIAGNOSIS — M19042 Primary osteoarthritis, left hand: Secondary | ICD-10-CM | POA: Diagnosis present

## 2017-04-17 DIAGNOSIS — Z791 Long term (current) use of non-steroidal anti-inflammatories (NSAID): Secondary | ICD-10-CM | POA: Diagnosis not present

## 2017-04-17 DIAGNOSIS — M549 Dorsalgia, unspecified: Secondary | ICD-10-CM | POA: Diagnosis present

## 2017-04-17 DIAGNOSIS — M19041 Primary osteoarthritis, right hand: Secondary | ICD-10-CM | POA: Diagnosis present

## 2017-04-17 DIAGNOSIS — Z888 Allergy status to other drugs, medicaments and biological substances status: Secondary | ICD-10-CM

## 2017-04-17 DIAGNOSIS — K219 Gastro-esophageal reflux disease without esophagitis: Secondary | ICD-10-CM | POA: Diagnosis present

## 2017-04-17 DIAGNOSIS — E8889 Other specified metabolic disorders: Secondary | ICD-10-CM | POA: Diagnosis present

## 2017-04-17 DIAGNOSIS — D7389 Other diseases of spleen: Secondary | ICD-10-CM | POA: Diagnosis not present

## 2017-04-17 DIAGNOSIS — R161 Splenomegaly, not elsewhere classified: Secondary | ICD-10-CM | POA: Diagnosis present

## 2017-04-17 DIAGNOSIS — E7800 Pure hypercholesterolemia, unspecified: Secondary | ICD-10-CM | POA: Diagnosis not present

## 2017-04-17 DIAGNOSIS — I7300 Raynaud's syndrome without gangrene: Secondary | ICD-10-CM | POA: Diagnosis not present

## 2017-04-17 DIAGNOSIS — I788 Other diseases of capillaries: Secondary | ICD-10-CM | POA: Diagnosis not present

## 2017-04-17 DIAGNOSIS — D1809 Hemangioma of other sites: Secondary | ICD-10-CM | POA: Diagnosis not present

## 2017-04-17 DIAGNOSIS — C8597 Non-Hodgkin lymphoma, unspecified, spleen: Secondary | ICD-10-CM | POA: Diagnosis not present

## 2017-04-17 HISTORY — PX: LAPAROSCOPIC SPLENECTOMY: SHX409

## 2017-04-17 LAB — TYPE AND SCREEN
ABO/RH(D): A POS
ANTIBODY SCREEN: NEGATIVE

## 2017-04-17 SURGERY — SPLENECTOMY, LAPAROSCOPIC
Anesthesia: General | Site: Abdomen

## 2017-04-17 MED ORDER — CEFAZOLIN SODIUM-DEXTROSE 2-4 GM/100ML-% IV SOLN
INTRAVENOUS | Status: AC
  Filled 2017-04-17: qty 100

## 2017-04-17 MED ORDER — HYDROMORPHONE HCL 1 MG/ML IJ SOLN
INTRAMUSCULAR | Status: AC
  Administered 2017-04-17: 0.5 mg via INTRAVENOUS
  Filled 2017-04-17: qty 1

## 2017-04-17 MED ORDER — HYDROMORPHONE HCL 1 MG/ML IJ SOLN
0.2500 mg | INTRAMUSCULAR | Status: DC | PRN
  Administered 2017-04-17 (×2): 0.5 mg via INTRAVENOUS

## 2017-04-17 MED ORDER — GABAPENTIN 300 MG PO CAPS
ORAL_CAPSULE | ORAL | Status: AC
  Filled 2017-04-17: qty 1

## 2017-04-17 MED ORDER — KETAMINE HCL 10 MG/ML IJ SOLN
INTRAMUSCULAR | Status: AC
  Filled 2017-04-17: qty 1

## 2017-04-17 MED ORDER — METHOCARBAMOL 1000 MG/10ML IJ SOLN
1000.0000 mg | Freq: Four times a day (QID) | INTRAVENOUS | Status: DC | PRN
  Administered 2017-04-17: 1000 mg via INTRAVENOUS
  Filled 2017-04-17: qty 10

## 2017-04-17 MED ORDER — DEXAMETHASONE SODIUM PHOSPHATE 10 MG/ML IJ SOLN
INTRAMUSCULAR | Status: AC
  Filled 2017-04-17: qty 1

## 2017-04-17 MED ORDER — BUPIVACAINE-EPINEPHRINE 0.25% -1:200000 IJ SOLN
INTRAMUSCULAR | Status: AC
  Filled 2017-04-17: qty 1

## 2017-04-17 MED ORDER — HYDROCORTISONE 2.5 % RE CREA: 1.0000 "application " | TOPICAL_CREAM | Freq: Four times a day (QID) | RECTAL | Status: DC | PRN

## 2017-04-17 MED ORDER — GABAPENTIN 300 MG PO CAPS
300.0000 mg | ORAL_CAPSULE | Freq: Every day | ORAL | Status: DC
  Administered 2017-04-17: 300 mg via ORAL
  Filled 2017-04-17: qty 1

## 2017-04-17 MED ORDER — LIDOCAINE HCL 2 % IJ SOLN
INTRAMUSCULAR | Status: AC
  Filled 2017-04-17: qty 20

## 2017-04-17 MED ORDER — BENEFIBER DRINK MIX PO PACK: 4.0000 g | PACK | Freq: Every day | ORAL | Status: DC

## 2017-04-17 MED ORDER — CELECOXIB 200 MG PO CAPS
200.0000 mg | ORAL_CAPSULE | ORAL | Status: AC
  Administered 2017-04-17: 200 mg via ORAL

## 2017-04-17 MED ORDER — HYDROMORPHONE HCL 1 MG/ML IJ SOLN: 0.5000 mg | INTRAMUSCULAR | Status: DC | PRN

## 2017-04-17 MED ORDER — SUGAMMADEX SODIUM 200 MG/2ML IV SOLN
INTRAVENOUS | Status: AC
  Filled 2017-04-17: qty 2

## 2017-04-17 MED ORDER — SCOPOLAMINE 1 MG/3DAYS TD PT72
MEDICATED_PATCH | TRANSDERMAL | Status: DC | PRN
  Administered 2017-04-17: 1 via TRANSDERMAL

## 2017-04-17 MED ORDER — BISACODYL 10 MG RE SUPP: 10.0000 mg | Freq: Every day | RECTAL | Status: DC | PRN

## 2017-04-17 MED ORDER — FENTANYL CITRATE (PF) 250 MCG/5ML IJ SOLN
INTRAMUSCULAR | Status: AC
  Filled 2017-04-17: qty 5

## 2017-04-17 MED ORDER — HYDROCORTISONE 1 % EX CREA: 1.0000 "application " | TOPICAL_CREAM | Freq: Three times a day (TID) | CUTANEOUS | Status: DC | PRN

## 2017-04-17 MED ORDER — SIMETHICONE 80 MG PO CHEW: 40.0000 mg | CHEWABLE_TABLET | Freq: Four times a day (QID) | ORAL | Status: DC | PRN

## 2017-04-17 MED ORDER — TRAMADOL HCL 50 MG PO TABS: 50.0000 mg | ORAL_TABLET | Freq: Four times a day (QID) | ORAL | Status: DC | PRN

## 2017-04-17 MED ORDER — SUGAMMADEX SODIUM 200 MG/2ML IV SOLN
INTRAVENOUS | Status: DC | PRN
  Administered 2017-04-17: 200 mg via INTRAVENOUS

## 2017-04-17 MED ORDER — LACTATED RINGERS IV SOLN
INTRAVENOUS | Status: DC
  Administered 2017-04-17: 08:00:00 via INTRAVENOUS

## 2017-04-17 MED ORDER — METOPROLOL TARTRATE 5 MG/5ML IV SOLN: 5.0000 mg | Freq: Four times a day (QID) | INTRAVENOUS | Status: DC | PRN

## 2017-04-17 MED ORDER — SODIUM CHLORIDE 0.9 % IV SOLN
INTRAVENOUS | Status: DC
  Administered 2017-04-17: 13:00:00 via INTRAVENOUS

## 2017-04-17 MED ORDER — DIPHENHYDRAMINE HCL 12.5 MG/5ML PO ELIX: 12.5000 mg | ORAL_SOLUTION | Freq: Four times a day (QID) | ORAL | Status: DC | PRN

## 2017-04-17 MED ORDER — MAGIC MOUTHWASH
15.0000 mL | Freq: Four times a day (QID) | ORAL | Status: DC | PRN
  Filled 2017-04-17: qty 15

## 2017-04-17 MED ORDER — SCOPOLAMINE 1 MG/3DAYS TD PT72
MEDICATED_PATCH | TRANSDERMAL | Status: AC
  Filled 2017-04-17: qty 1

## 2017-04-17 MED ORDER — LIDOCAINE 2% (20 MG/ML) 5 ML SYRINGE
INTRAMUSCULAR | Status: DC | PRN
  Administered 2017-04-17: 1.5 mg/kg/h via INTRAVENOUS

## 2017-04-17 MED ORDER — OXYCODONE HCL 5 MG PO TABS: 5.0000 mg | ORAL_TABLET | ORAL | Status: DC | PRN

## 2017-04-17 MED ORDER — ENOXAPARIN SODIUM 40 MG/0.4ML ~~LOC~~ SOLN
40.0000 mg | SUBCUTANEOUS | Status: DC
  Administered 2017-04-18: 40 mg via SUBCUTANEOUS
  Filled 2017-04-17: qty 0.4

## 2017-04-17 MED ORDER — CEFAZOLIN SODIUM-DEXTROSE 2-4 GM/100ML-% IV SOLN
2.0000 g | INTRAVENOUS | Status: AC
  Administered 2017-04-17: 2 g via INTRAVENOUS

## 2017-04-17 MED ORDER — FENTANYL CITRATE (PF) 100 MCG/2ML IJ SOLN
INTRAMUSCULAR | Status: DC | PRN
  Administered 2017-04-17: 150 ug via INTRAVENOUS
  Administered 2017-04-17 (×2): 50 ug via INTRAVENOUS

## 2017-04-17 MED ORDER — ACETAMINOPHEN 500 MG PO TABS
1000.0000 mg | ORAL_TABLET | Freq: Three times a day (TID) | ORAL | Status: DC
  Administered 2017-04-17 – 2017-04-18 (×3): 1000 mg via ORAL
  Filled 2017-04-17 (×3): qty 2

## 2017-04-17 MED ORDER — LACTATED RINGERS IR SOLN
Status: DC | PRN
  Administered 2017-04-17: 1000 mL

## 2017-04-17 MED ORDER — LACTATED RINGERS IV BOLUS (SEPSIS): 1000.0000 mL | Freq: Three times a day (TID) | INTRAVENOUS | Status: DC | PRN

## 2017-04-17 MED ORDER — PROCHLORPERAZINE MALEATE 10 MG PO TABS: 10.0000 mg | ORAL_TABLET | Freq: Four times a day (QID) | ORAL | Status: DC | PRN

## 2017-04-17 MED ORDER — PROPOFOL 10 MG/ML IV BOLUS
INTRAVENOUS | Status: AC
Start: 2017-04-17 — End: ?
  Filled 2017-04-17: qty 20

## 2017-04-17 MED ORDER — ONDANSETRON HCL 4 MG/2ML IJ SOLN
INTRAMUSCULAR | Status: AC
  Filled 2017-04-17: qty 2

## 2017-04-17 MED ORDER — RESOURCE INSTANT PROTEIN PO PWD PACKET
1.0000 | Freq: Every day | ORAL | Status: DC
  Filled 2017-04-17: qty 6

## 2017-04-17 MED ORDER — MEPERIDINE HCL 50 MG/ML IJ SOLN
6.2500 mg | INTRAMUSCULAR | Status: DC | PRN
Start: 2017-04-17 — End: 2017-04-17

## 2017-04-17 MED ORDER — GABAPENTIN 300 MG PO CAPS
300.0000 mg | ORAL_CAPSULE | ORAL | Status: AC
  Administered 2017-04-17: 300 mg via ORAL

## 2017-04-17 MED ORDER — DEXAMETHASONE SODIUM PHOSPHATE 4 MG/ML IJ SOLN: 4.0000 mg | INTRAMUSCULAR | Status: DC

## 2017-04-17 MED ORDER — LORATADINE 10 MG PO TABS
10.0000 mg | ORAL_TABLET | Freq: Every day | ORAL | Status: DC
  Administered 2017-04-18: 10 mg via ORAL
  Filled 2017-04-17: qty 1

## 2017-04-17 MED ORDER — MIDAZOLAM HCL 2 MG/2ML IJ SOLN
INTRAMUSCULAR | Status: DC | PRN
  Administered 2017-04-17: 2 mg via INTRAVENOUS

## 2017-04-17 MED ORDER — ONDANSETRON HCL 4 MG/2ML IJ SOLN: 4.0000 mg | Freq: Four times a day (QID) | INTRAMUSCULAR | Status: DC | PRN

## 2017-04-17 MED ORDER — ZOLPIDEM TARTRATE 5 MG PO TABS: 5.0000 mg | ORAL_TABLET | Freq: Every evening | ORAL | Status: DC | PRN

## 2017-04-17 MED ORDER — POLYETHYLENE GLYCOL 3350 17 G PO PACK: 17.0000 g | PACK | Freq: Every day | ORAL | Status: DC | PRN

## 2017-04-17 MED ORDER — GUAIFENESIN-DM 100-10 MG/5ML PO SYRP: 10.0000 mL | ORAL_SOLUTION | ORAL | Status: DC | PRN

## 2017-04-17 MED ORDER — ROCURONIUM BROMIDE 10 MG/ML (PF) SYRINGE
PREFILLED_SYRINGE | INTRAVENOUS | Status: DC | PRN
  Administered 2017-04-17: 50 mg via INTRAVENOUS
  Administered 2017-04-17: 10 mg via INTRAVENOUS

## 2017-04-17 MED ORDER — CELECOXIB 200 MG PO CAPS
ORAL_CAPSULE | ORAL | Status: AC
  Filled 2017-04-17: qty 1

## 2017-04-17 MED ORDER — LIP MEDEX EX OINT
1.0000 "application " | TOPICAL_OINTMENT | Freq: Two times a day (BID) | CUTANEOUS | Status: DC
  Administered 2017-04-17 – 2017-04-18 (×2): 1 via TOPICAL
  Filled 2017-04-17: qty 7

## 2017-04-17 MED ORDER — FINASTERIDE 5 MG PO TABS
2.5000 mg | ORAL_TABLET | Freq: Every evening | ORAL | Status: DC
  Administered 2017-04-17: 2.5 mg via ORAL
  Filled 2017-04-17: qty 0.5

## 2017-04-17 MED ORDER — LACTATED RINGERS IV SOLN: 1000.0000 mL | Freq: Three times a day (TID) | INTRAVENOUS | Status: DC | PRN

## 2017-04-17 MED ORDER — DIPHENHYDRAMINE HCL 50 MG/ML IJ SOLN: 12.5000 mg | Freq: Four times a day (QID) | INTRAMUSCULAR | Status: DC | PRN

## 2017-04-17 MED ORDER — METHOCARBAMOL 500 MG PO TABS: 1000.0000 mg | ORAL_TABLET | Freq: Four times a day (QID) | ORAL | Status: DC | PRN

## 2017-04-17 MED ORDER — ONDANSETRON HCL 4 MG/2ML IJ SOLN
INTRAMUSCULAR | Status: DC | PRN
  Administered 2017-04-17: 4 mg via INTRAVENOUS

## 2017-04-17 MED ORDER — METOCLOPRAMIDE HCL 5 MG/ML IJ SOLN: 10.0000 mg | Freq: Once | INTRAMUSCULAR | Status: DC | PRN

## 2017-04-17 MED ORDER — DIPHENHYDRAMINE HCL 50 MG/ML IJ SOLN
INTRAMUSCULAR | Status: DC | PRN
  Administered 2017-04-17: 12.5 mg via INTRAVENOUS

## 2017-04-17 MED ORDER — TRAMADOL HCL 50 MG PO TABS: 50.0000 mg | ORAL_TABLET | Freq: Four times a day (QID) | ORAL | 0 refills | Status: DC | PRN

## 2017-04-17 MED ORDER — DIPHENHYDRAMINE HCL 50 MG/ML IJ SOLN
INTRAMUSCULAR | Status: AC
  Filled 2017-04-17: qty 1

## 2017-04-17 MED ORDER — ROCURONIUM BROMIDE 10 MG/ML (PF) SYRINGE
PREFILLED_SYRINGE | INTRAVENOUS | Status: AC
  Filled 2017-04-17: qty 5

## 2017-04-17 MED ORDER — DOXYCYCLINE HYCLATE 100 MG PO TABS: 50.0000 mg | ORAL_TABLET | Freq: Every day | ORAL | Status: DC

## 2017-04-17 MED ORDER — AMLODIPINE BESYLATE 5 MG PO TABS
5.0000 mg | ORAL_TABLET | Freq: Every day | ORAL | Status: DC
  Administered 2017-04-18: 5 mg via ORAL
  Filled 2017-04-17: qty 1

## 2017-04-17 MED ORDER — ACETAMINOPHEN 500 MG PO TABS
1000.0000 mg | ORAL_TABLET | ORAL | Status: AC
  Administered 2017-04-17: 1000 mg via ORAL

## 2017-04-17 MED ORDER — PROPOFOL 10 MG/ML IV BOLUS
INTRAVENOUS | Status: DC | PRN
  Administered 2017-04-17: 120 mg via INTRAVENOUS

## 2017-04-17 MED ORDER — FLUTICASONE PROPIONATE 50 MCG/ACT NA SUSP: 2.0000 | Freq: Every day | NASAL | Status: DC | PRN

## 2017-04-17 MED ORDER — FENTANYL CITRATE (PF) 100 MCG/2ML IJ SOLN
INTRAMUSCULAR | Status: AC
  Filled 2017-04-17: qty 2

## 2017-04-17 MED ORDER — LIDOCAINE 2% (20 MG/ML) 5 ML SYRINGE
INTRAMUSCULAR | Status: DC | PRN
  Administered 2017-04-17: 100 mg via INTRAVENOUS

## 2017-04-17 MED ORDER — MIDAZOLAM HCL 2 MG/2ML IJ SOLN
INTRAMUSCULAR | Status: AC
  Filled 2017-04-17: qty 2

## 2017-04-17 MED ORDER — ENSURE PRE-SURGERY PO LIQD
592.0000 mL | Freq: Once | ORAL | Status: DC
  Filled 2017-04-17: qty 592

## 2017-04-17 MED ORDER — DEXAMETHASONE SODIUM PHOSPHATE 10 MG/ML IJ SOLN
INTRAMUSCULAR | Status: DC | PRN
  Administered 2017-04-17: 10 mg via INTRAVENOUS

## 2017-04-17 MED ORDER — BUPIVACAINE LIPOSOME 1.3 % IJ SUSP
INTRAMUSCULAR | Status: DC | PRN
  Administered 2017-04-17: 20 mL

## 2017-04-17 MED ORDER — ENSURE PRE-SURGERY PO LIQD
296.0000 mL | Freq: Once | ORAL | Status: DC
  Filled 2017-04-17: qty 296

## 2017-04-17 MED ORDER — HYDRALAZINE HCL 20 MG/ML IJ SOLN: 5.0000 mg | INTRAMUSCULAR | Status: DC | PRN

## 2017-04-17 MED ORDER — MENTHOL 3 MG MT LOZG: 1.0000 | LOZENGE | OROMUCOSAL | Status: DC | PRN

## 2017-04-17 MED ORDER — 0.9 % SODIUM CHLORIDE (POUR BTL) OPTIME
TOPICAL | Status: DC | PRN
  Administered 2017-04-17: 1000 mL

## 2017-04-17 MED ORDER — PHENOL 1.4 % MT LIQD: 1.0000 | OROMUCOSAL | Status: DC | PRN

## 2017-04-17 MED ORDER — IBUPROFEN 200 MG PO TABS: 600.0000 mg | ORAL_TABLET | Freq: Four times a day (QID) | ORAL | Status: DC | PRN

## 2017-04-17 MED ORDER — KETAMINE HCL 10 MG/ML IJ SOLN
INTRAMUSCULAR | Status: DC | PRN
  Administered 2017-04-17: 40 mg via INTRAVENOUS

## 2017-04-17 MED ORDER — ACETAMINOPHEN 500 MG PO TABS
ORAL_TABLET | ORAL | Status: AC
  Filled 2017-04-17: qty 2

## 2017-04-17 MED ORDER — LIDOCAINE 2% (20 MG/ML) 5 ML SYRINGE
INTRAMUSCULAR | Status: AC
  Filled 2017-04-17: qty 5

## 2017-04-17 MED ORDER — BUPIVACAINE-EPINEPHRINE 0.25% -1:200000 IJ SOLN
INTRAMUSCULAR | Status: DC | PRN
  Administered 2017-04-17: 50 mL

## 2017-04-17 MED ORDER — ALUM & MAG HYDROXIDE-SIMETH 200-200-20 MG/5ML PO SUSP: 30.0000 mL | Freq: Four times a day (QID) | ORAL | Status: DC | PRN

## 2017-04-17 MED ORDER — ONDANSETRON 4 MG PO TBDP: 4.0000 mg | ORAL_TABLET | Freq: Four times a day (QID) | ORAL | Status: DC | PRN

## 2017-04-17 MED ORDER — METRONIDAZOLE 0.75 % EX GEL
1.0000 "application " | Freq: Every evening | CUTANEOUS | Status: DC
  Administered 2017-04-17: 1 via TOPICAL
  Filled 2017-04-17: qty 45

## 2017-04-17 MED ORDER — PROCHLORPERAZINE EDISYLATE 5 MG/ML IJ SOLN: 5.0000 mg | Freq: Four times a day (QID) | INTRAMUSCULAR | Status: DC | PRN

## 2017-04-17 SURGICAL SUPPLY — 53 items
APPLIER CLIP ROT 10 11.4 M/L (STAPLE)
APPLIER CLIP ROT 13.4 12 LRG (CLIP)
BAG LAPAROSCOPIC 12 15 PORT 16 (BASKET) ×1 IMPLANT
BAG RETRIEVAL 12/15 (BASKET) ×2
BAG RETRIEVAL 12/15MM (BASKET) ×1
CABLE HIGH FREQUENCY MONO STRZ (ELECTRODE) ×3 IMPLANT
CHLORAPREP W/TINT 26ML (MISCELLANEOUS) ×3 IMPLANT
CLIP APPLIE ROT 10 11.4 M/L (STAPLE) IMPLANT
CLIP APPLIE ROT 13.4 12 LRG (CLIP) IMPLANT
CONNECTOR 5 IN 1 STRAIGHT STRL (MISCELLANEOUS) IMPLANT
DECANTER SPIKE VIAL GLASS SM (MISCELLANEOUS) ×3 IMPLANT
DEVICE TROCAR PUNCTURE CLOSURE (ENDOMECHANICALS) ×3 IMPLANT
DRAPE WARM FLUID 44X44 (DRAPE) ×3 IMPLANT
DRSG TEGADERM 2-3/8X2-3/4 SM (GAUZE/BANDAGES/DRESSINGS) ×9 IMPLANT
DRSG TEGADERM 4X4.75 (GAUZE/BANDAGES/DRESSINGS) ×3 IMPLANT
ELECT PENCIL ROCKER SW 15FT (MISCELLANEOUS) ×3 IMPLANT
ENDOLOOP SUT PDS II  0 18 (SUTURE)
ENDOLOOP SUT PDS II 0 18 (SUTURE) IMPLANT
GAUZE SPONGE 2X2 8PLY STRL LF (GAUZE/BANDAGES/DRESSINGS) ×1 IMPLANT
GLOVE ECLIPSE 8.0 STRL XLNG CF (GLOVE) ×3 IMPLANT
GLOVE INDICATOR 8.0 STRL GRN (GLOVE) ×3 IMPLANT
GOWN STRL REUS W/TWL XL LVL3 (GOWN DISPOSABLE) ×12 IMPLANT
IRRIG SUCT STRYKERFLOW 2 WTIP (MISCELLANEOUS) ×3
IRRIGATION SUCT STRKRFLW 2 WTP (MISCELLANEOUS) ×1 IMPLANT
KIT BASIN OR (CUSTOM PROCEDURE TRAY) ×3 IMPLANT
PAD POSITIONING PINK XL (MISCELLANEOUS) ×3 IMPLANT
POSITIONER SURGICAL ARM (MISCELLANEOUS) ×3 IMPLANT
POUCH SPECIMEN RETRIEVAL 10MM (ENDOMECHANICALS) IMPLANT
RELOAD STAPLER WHITE 60MM (STAPLE) ×2 IMPLANT
SCISSORS LAP 5X35 DISP (ENDOMECHANICALS) ×3 IMPLANT
SHEARS HARMONIC ACE PLUS 36CM (ENDOMECHANICALS) ×3 IMPLANT
SLEEVE XCEL OPT CAN 5 100 (ENDOMECHANICALS) ×6 IMPLANT
SPONGE GAUZE 2X2 STER 10/PKG (GAUZE/BANDAGES/DRESSINGS) ×2
SPONGE LAP 18X18 X RAY DECT (DISPOSABLE) ×3 IMPLANT
STAPLER ECHELON FLEX (STAPLE) IMPLANT
STAPLER ECHELON LONG 60 440 (INSTRUMENTS) ×3 IMPLANT
STAPLER RELOAD WHITE 60MM (STAPLE) ×6
STAPLER VISISTAT 35W (STAPLE) ×3 IMPLANT
SUT MNCRL AB 4-0 PS2 18 (SUTURE) ×3 IMPLANT
SUT PDS AB 1 CTX 36 (SUTURE) ×6 IMPLANT
SUT PDS AB 1 TP1 96 (SUTURE) IMPLANT
SUT VICRYL 0 UR6 27IN ABS (SUTURE) IMPLANT
TAPE UMBILICAL COTTON 1/8X30 (MISCELLANEOUS) IMPLANT
TOWEL OR 17X26 10 PK STRL BLUE (TOWEL DISPOSABLE) ×3 IMPLANT
TRAY FOLEY W/METER SILVER 16FR (SET/KITS/TRAYS/PACK) IMPLANT
TRAY LAPAROSCOPIC (CUSTOM PROCEDURE TRAY) ×3 IMPLANT
TROCAR BLADELESS OPT 5 100 (ENDOMECHANICALS) ×3 IMPLANT
TROCAR XCEL 12X100 BLDLESS (ENDOMECHANICALS) IMPLANT
TROCAR XCEL NON-BLD 11X100MML (ENDOMECHANICALS) IMPLANT
TUBING CONNECTING 10 (TUBING) IMPLANT
TUBING CONNECTING 10' (TUBING)
TUBING INSUF HEATED (TUBING) ×3 IMPLANT
YANKAUER SUCT BULB TIP 10FT TU (MISCELLANEOUS) ×3 IMPLANT

## 2017-04-17 NOTE — Anesthesia Procedure Notes (Signed)
Procedure Name: Intubation Date/Time: 04/17/2017 8:44 AM Performed by: Sharlette Dense, CRNA Patient Re-evaluated:Patient Re-evaluated prior to induction Oxygen Delivery Method: Circle system utilized Preoxygenation: Pre-oxygenation with 100% oxygen Induction Type: IV induction Ventilation: Mask ventilation without difficulty and Oral airway inserted - appropriate to patient size Laryngoscope Size: Sabra Heck and 2 Grade View: Grade I Tube type: Oral Tube size: 7.5 mm Number of attempts: 1 Airway Equipment and Method: Stylet Placement Confirmation: ETT inserted through vocal cords under direct vision,  positive ETCO2 and breath sounds checked- equal and bilateral Secured at: 21 cm Tube secured with: Tape Dental Injury: Teeth and Oropharynx as per pre-operative assessment

## 2017-04-17 NOTE — Transfer of Care (Signed)
Immediate Anesthesia Transfer of Care Note  Patient: Camreigh Rorrer [place]  Procedure(s) Performed: LAPAROSCOPIC SPLENECTOMY (N/A Abdomen)  Patient Location: PACU  Anesthesia Type:General  Level of Consciousness: sedated  Airway & Oxygen Therapy: Patient Spontanous Breathing, Patient connected to face mask oxygen and oral airway in place  Post-op Assessment: Report given to RN and Post -op Vital signs reviewed and stable  Post vital signs: Reviewed and stable  Last Vitals:  Vitals:   04/17/17 0636  BP: 138/87  Pulse: 90  Resp: 18  Temp: 36.5 C  SpO2: 100%    Last Pain:  Vitals:   04/17/17 0636  TempSrc: Oral      Patients Stated Pain Goal: 4 (56/21/30 8657)  Complications: No apparent anesthesia complications

## 2017-04-17 NOTE — Anesthesia Postprocedure Evaluation (Signed)
Anesthesia Post Note  Patient: Holly Golden  Procedure(s) Performed: LAPAROSCOPIC SPLENECTOMY (N/A Abdomen)     Patient location during evaluation: PACU Anesthesia Type: General Level of consciousness: awake and alert and oriented Pain management: pain level controlled Vital Signs Assessment: post-procedure vital signs reviewed and stable Respiratory status: spontaneous breathing, nonlabored ventilation, respiratory function stable and patient connected to nasal cannula oxygen Cardiovascular status: blood pressure returned to baseline and stable Postop Assessment: no apparent nausea or vomiting Anesthetic complications: no    Last Vitals:  Vitals:   04/17/17 1100 04/17/17 1115  BP: 126/79 120/77  Pulse: 82 80  Resp: (!) 23 19  Temp:  36.9 C  SpO2: 95% 95%    Last Pain:  Vitals:   04/17/17 1115  TempSrc:   PainSc: 5                  Neely Kammerer A.

## 2017-04-17 NOTE — Interval H&P Note (Signed)
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  The various methods of treatment have been discussed with the patient and family. After consideration of risks, benefits and other options for treatment, the patient has consented to  Procedure(s): LAPAROSCOPIC SPLENECTOMY (N/A) as a surgical intervention .  The patient's history has been reviewed, patient examined, no change in status, stable for surgery.  I have reviewed the patient's chart and labs.  Questions were answered to the patient's satisfaction.     Adin Hector

## 2017-04-17 NOTE — H&P (Signed)
Holly Golden DOB: 10-May-1970 Married / Language: English / Race: White Female  Patient Care Team: Rory Percy, MD as PCP - General (Family Medicine) Michael Boston, MD as Consulting Physician (General Surgery) Penland, Kelby Fam, MD (Inactive) as Consulting Physician (Hematology and Oncology) Bo Merino, MD as Consulting Physician (Rheumatology) Rogene Houston, MD as Consulting Physician (Gastroenterology)  04/17/2017   The patient is a 47 year old female who presents with a splenic disorder. Note for "Splenic disorder":  She was sent to me early in the year by her hematologist with chronic left back/flank pain and increasing lesions of the spleen. Possible need for splenectomy. Dr. Whitney Muse.  Patient returns. I had seen her earlier in the year. I had recommended considering splenectomy. She held off. She notes the pain is gone worse in her left posterior back on her rib cage just below her shoulder braids. Worse with deep breath or movement. She is seen back specialist with negative workup. She thought she might had a kidney stone. Workup was negative. Still has splenic lesions. No changes in her bowel habits. No drop in her platelets or other abnormalities on prior workups from last year. However she noted worsening pain over the summer. Ultrasound confirmed that she still had numerous splenic lesions. Presumed hemangiomas. Possibly others. No other abnormalities.  Pleasant woman. Comes today with her sister and mother. Worse in a doctor's office. Had some abdominal discomfort. Had a CAT scan that noted hypodense lesions in her spleen and a pelvic mass. Had resection of the pelvic mass. Splenic lesions have been followed. First noted 2005. More recently, she's had some LEFT flank discomfort to the point that she does not like to lie on that side. At least the past 6 months. No nausea or vomiting. Occasional constipation/diarrhea since her  cholecystectomy years ago. However not severe. Had colonoscopy 2013 with a few small polyps removed. Some hemorrhoids noted felt best likely be the source of some mild rectal bleeding. He has a little bleeding when she brushes her teeth but nothing severe. Has had nosebleeds a couple times but nothing severe. No hematuria. Sometimes she feels like she can bruising easily but nothing too severe. Had some varicose vein surgery in the past. Can walk several miles without difficulty. Was sent to hematology for concerns.  Repeat CT scan 2016 shows increased number of hypodense lesions on the spleen, replacing the spleen more. Because of symptoms of discomfort and more lesions, surgical consultation requested. Patient denies any history of fall or trauma. No fevers chills or sweats. Energy level okay. No weight loss. No family history of leukemia or lymphoma. No history of any neurological or skin issues. No skin bruises/lesions. No recent UTIs. No heartburn or reflux. No history of hepatitis or pancreatitis. No dysphagia to solids or liquids.    CLINICAL DATA: Left upper quadrant abdominal pain. Splenomegaly. Splenic and hepatic hemangiomatosis. Bleeding gums. Left upper quadrant abdominal pain.  EXAM: CT ABDOMEN AND PELVIS WITH CONTRAST  TECHNIQUE: Multidetector CT imaging of the abdomen and pelvis was performed using the standard protocol following bolus administration of intravenous contrast.  CONTRAST: 137mL OMNIPAQUE IOHEXOL 300 MG/ML SOLN  COMPARISON: 02/18/2015 and report from 01/21/2011  FINDINGS: Lower chest: Unremarkable  Hepatobiliary: Cholecystectomy. Suspected mild hepatic steatosis.  Pancreas: Unremarkable  Spleen: Numerous hypoenhancing masses are once again identified throughout the spleen. Degree of splenic parenchymal replacement is increased due to increased size and number of lesions. Upper normal splenic volume at 400  cc.  Adrenals/Urinary Tract: Simple  appearing 1.7 cm right mid kidney cyst, no difference in density on portal venous and delayed phase images. Small hypodense lesions in the left kidney are technically too small to characterize although statistically likely to be benign.  Stomach/Bowel: Unremarkable. Appendix normal.  Vascular/Lymphatic: Unremarkable  Reproductive: Unremarkable  Other: No supplemental non-categorized findings.  Musculoskeletal: Unremarkable  IMPRESSION: 1. Increase in the size and number of enhancing splenic lesions with a greater degree of replacement of the splenic parenchyma. Differential diagnostic considerations include splenic hemangiomatosis, Littoral cell angioma, lymphangiomatosis, lymphoma, or metastatic disease. However, given the relatively slow advancement from 2012, malignancy seems less likely. 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 2. Mild hepatic steatosis.   Electronically Signed By: Van Clines M.D. 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 Ibuprofen 200 (200MG  Tablet, Oral as needed) Active.  Social History Adin Hector, MD; 03/11/2017 3:13 PM) Caffeine use  Carbonated beverages, Coffee, Tea. No alcohol use  No drug use  Tobacco use  Never smoker.  Family History Adin Hector, MD; 03/11/2017 3:13 PM) Arthritis  Father, Mother. Depression  Brother. Migraine Headache  Mother. Seizure disorder  Brother, Mother. Thyroid problems  Mother.  Pregnancy / Birth History Adin Hector, MD; 03/11/2017 3:13 PM) Age at menarche  25 years. Gravida  2 Maternal age  34-20 Para  2 Regular periods   Other Problems Adin Hector, MD; 03/11/2017 3:13 PM) Cholelithiasis  Gastroesophageal Reflux Disease  Hemorrhoids  Hypercholesterolemia  Kidney Stone  Oophorectomy  Left. Other disease, cancer, significant illness  Vascular Disease     Review of Systems Adin Hector, MD; 03/11/2017 3:13 PM) General Not Present- Appetite Loss, Chills, Fatigue, Fever, Night Sweats, Weight Gain and Weight Loss. Skin Not Present- Change in Wart/Mole, Dryness, Hives, Jaundice, New Lesions, Non-Healing Wounds, Rash and Ulcer. HEENT Present- Nose Bleed, Seasonal Allergies and Wears glasses/contact lenses. Not Present- Earache, Hearing Loss, Hoarseness, Oral Ulcers, Ringing in the Ears, Sinus Pain, Sore Throat, Visual Disturbances and Yellow Eyes. Respiratory Not Present- Bloody sputum, Chronic Cough, Difficulty Breathing, Snoring and Wheezing. Breast Not Present- Breast Mass, Breast Pain, Nipple Discharge and Skin Changes. Cardiovascular Present- Swelling of Extremities. Not Present- Chest Pain, Difficulty Breathing Lying Down, Leg Cramps, Palpitations, Rapid Heart Rate and Shortness of Breath. Gastrointestinal Present- Abdominal Pain, Bloating, Bloody Stool, Chronic diarrhea, Constipation, Gets full quickly at meals, Hemorrhoids and Indigestion. Not Present- Change in Bowel  Habits, Difficulty Swallowing, Excessive gas, Nausea, Rectal Pain and Vomiting. Female Genitourinary Not Present- Frequency, Nocturia, Painful Urination, Pelvic Pain and Urgency. Musculoskeletal Present- Joint Pain, Joint Stiffness and Swelling of Extremities. Not Present- Back Pain, Muscle Pain and Muscle Weakness. Neurological Present- Headaches.  Not Present- Decreased Memory, Fainting, Numbness, Seizures, Tingling, Tremor, Trouble walking and Weakness. Psychiatric Not Present- Anxiety, Bipolar, Change in Sleep Pattern, Depression, Fearful and Frequent crying. Endocrine Present- Cold Intolerance and Hair Changes. Not Present- Excessive Hunger, Heat Intolerance, Hot flashes and New Diabetes. Hematology Present- Easy Bruising. Not Present- Excessive bleeding, Gland problems, HIV and Persistent Infections.  Vitals (Janette Ranson CMA; 03/11/2017 3:07 PM) 03/11/2017 3:07 PM Weight: 173 lb Height: 64in Body Surface Area: 1.84 m Body Mass Index: 29.7 kg/m  Pulse: 84 (Regular)  BP: 130/70 (Sitting, Left Arm, Standard)   BP 138/87   Pulse 90   Temp 97.7 F (36.5 C) (Oral)   Resp 18   Ht 5\' 3"  (1.6 m)   Wt 76.2 kg (168 lb)   LMP 04/12/2017 Comment: pregnancy test negative  SpO2 100%   BMI 29.76 kg/m   General: Pt awake/alert/oriented x4 in no major acute distress Eyes: PERRL, normal EOM. Sclera nonicteric Neuro: CN II-XII intact w/o focal sensory/motor deficits. Lymph: No head/neck/groin lymphadenopathy Psych:  No delerium/psychosis/paranoia HENT: Normocephalic, Mucus membranes moist.  No thrush Neck: Supple, No tracheal deviation Chest: No pain.  Good respiratory excursion. CV:  Pulses intact.  Regular rhythm MS: Normal AROM mjr joints.  No obvious deformity Abdomen: Soft, Nondistended.  Nontender.  No incarcerated hernias. Ext:  SCDs BLE.  No significant edema.  No cyanosis Skin: No petechiae / purpura         Assessment & Plan SPLENIC MASS  (R16.1) Impression: Moderate splenomegaly. Increase in the size and number of enhancing splenic lesions with a greater degree of replacement of the splenic parenchyma over 5 years. Differential diagnostic considerations include splenic hemangiomatosis, Littoral cell angioma, lymphangiomatosis, lymphoma, or metastatic disease.  I have discussed the case with my colleagues, Drs Anda Latina, Kinsinger, & Marlou Starks. Reviewed the films with the patient and her family in the past.  Given the fact that she's had more lesions over the past decade and she's being having some persistent symptoms of LEFT flank discomfort greater than 2 years despite negative workup by orthopedics, negative urological workup, negative GI workup, no other rheumatological etiology. I'm concerned that observation is not the best course.  I'm concerned that she will continue to get more of these lesions. She is only in her 77s with more time for this to get into further trouble. Her pain has not resolved despite an anti-inflammatory regimen. I don't have any other etiology for pain. She hurts where her spleen is. Therefore, I still again recommend splenectomy for diagnosis/treatment and possible symptom relief.  Would need preoperative immunization - done 2017  She is now ready to consider surgery a 3rd time, still nervous about it. She agrees she's exhaust ed other etiologies in the differential diagnosis.  She says she always has problems with urinary dysfunction every time they put a catheter. Void OCTOR fine  IRREGULAR BOWEL HABITS (R19.8) Impression: No strong evidence of obstruction or other abnormality. Perhaps constipation with occasional overflow. Perhaps IBS picture. She is due to see gastroenterology for follow-up colonoscopy for polyps. No progressive symptoms, so most likely not a mechanical issues like a stricture or tumor. Perhaps IBS.  I'm skeptical spleen is causing any obstructive or other symptoms.  I  again recommended adding a fiber bowel regimen to see if that would help her out. Consider flaxseed since she seems to have some bloating associated with it.   Adin Hector, M.D., F.A.C.S. Gastrointestinal and Minimally Invasive Surgery [facility] [address] [place], [place] [address] [phone] Main / Paging

## 2017-04-17 NOTE — Op Note (Signed)
04/17/2017  10:14 AM  PATIENT:  Holly Golden  47 y.o. female  Patient Care Team: Rory Percy, MD as PCP - General (Family Medicine) Michael Boston, MD as Consulting Physician (General Surgery) Penland, Kelby Fam, MD (Inactive) as Consulting Physician (Hematology and Oncology) Bo Merino, MD as Consulting Physician (Rheumatology) Rogene Houston, MD as Consulting Physician (Gastroenterology)  PRE-OPERATIVE DIAGNOSIS:  NUMEROUS SPLENIC LESIONS. POSSIBLE LYMPHOMA.  POST-OPERATIVE DIAGNOSIS:  INCREASING SPLENIC LESIONS. POSSIBLE LYMPHOMA.  PROCEDURE:  LAPAROSCOPIC SPLENECTOMY  SURGEON:  Adin Hector, MD  ASSISTANT: Erroll Luna, MD , FACS   ANESTHESIA:   local and general  EBL:  Total I/O In: -  Out: 3 [Blood:50].  See anesthesia record  Delay start of Pharmacological VTE agent (>24hrs) due to surgical blood loss or risk of bleeding:  no  DRAINS: none   SPECIMEN:  Source of Specimen:  SPLEEN (Morcelated)  DISPOSITION OF SPECIMEN:  PATHOLOGY  COUNTS:  YES  PLAN OF CARE: Admit for overnight observation  PATIENT DISPOSITION:  PACU - hemodynamically stable.  INDICATION: Pleasant woman with cystic lesions on her spleen increasing in number and size over the past few years.  Worsening left posterior back pain as well.  Possible lymphoma question raised by hematology oncology.  Discussions made many times to consider splenectomy.  Because of increasing discomfort she wished to proceed.  She had already had immunizations to meningococcus, pneumococcus, Haemophilus influenza.  The anatomy and physiology of the spleen was discussed.  Pathophysiology of the disease was discussed.  Options were discussed, and I made a recommendation to remove the spleen to help treat the pathology.  Minimally invasive & open techniques discussed.  Risks of bleeding, infection, injury to other organs, reoperation, death, and other risks were discussed.   I noted a good likelihood this  will help address the problem.  While there are risks, I feel the risks of nonoperative management are greater; therefore, I feel surgery offers the best option. Educational material was available.  We will work to minimize complications.   OR FINDINGS: Enlarged spleen with numerous spherical nodules consistent with numerous cyst.  Splenic volume of about 3 times normal.  No obvious major accessory spleens.  No evidence of any peritoneal disease on visceral or parietal peritoneum.  DESCRIPTION:   Informed consent was confirmed.  The patient underwent general anaesthesia without difficulty.  The patient was positioned appropriately.  VTE prevention in place.  The patient's abdomen was clipped, prepped, & draped in a sterile fashion.  Surgical timeout confirmed our plan.  Peritoneal entry with a laparoscopic port was obtained using optical entry technique in the left upper abdomen as the patient was positioned in reverse Trendelenburg.  Entry was clean.  I induced carbon dioxide insufflation.  Camera inspection revealed no injury.  Extra ports were carefully placed under direct laparoscopic visualization.  Visualization could easily find the enlarged spleen in the left upper quadrant.  We freed off and omental adhesions on the inferior pole and followed medially.  Continued until he came to the pedicle.  Skeletonized the superficial layers of that and then followed more superiorly.  Freed off the gastrosplenic attachments and short gastric vessels to the superior pole of the spleen.  Did further skeletonization on the inferior pole and came around the pedicle again.  Was able to create a window through the dominant splenic artery.  Transected that with a white load laparoscopic stapler to good result.  Skeletonized the dominant splenic vein and came around it.  Stapled  it off as well.  We then mobilized the spleen in a lateral medial fashion.  No major anterior adhesions.  Freed off the lateral  retroperitoneal adhesions until completely released.  Hemostasis was excellent.  We placed the spleen and inside a giant Endo Catch bag.  Brought up through a left lateral staple port site.  I opened up the fascia a little bit.  We then morcellated the spleen through the bag using ringed sponge stick forceps.  Was able to move the spleen and pieces with moderately large chunks for pathology.  There is no breech in the bag.  We changed gloves.  Camera inspection revealed good hemostasis.  No injury to the stomach.  I did some gentle irrigation.  Staple lines intact.  No evidence of any accessory spleens.  No spillage nor staining.  I closed the extraction port site with #1 PDS suture x2.  Aggressive field block made with local anesthetic containing liposomal bupivacaine especially at the extraction port site.  No appearing evacuated.  Skin closed with 4-0 Monocryl suture.  Sterile dressing applied.  Patient being extubated and sent to the recovery room.  I discussed postop care with the patient in the office with postoperative instructions.  Discussed again with the holding area.I discussed operative findings, updated the patient's status, discussed probable steps to recovery, and gave postoperative recommendations to the patient's family.  Recommendations were made.  Questions were answered.  They expressed understanding & appreciation.    Adin Hector, M.D., F.A.C.S. Gastrointestinal and Minimally Invasive Surgery [facility] [address], [place], [place] [address] [phone] Main / Paging

## 2017-04-17 NOTE — Discharge Instructions (Signed)
LAPAROSCOPIC SURGERY: POST OP INSTRUCTIONS  ######################################################################  EAT Gradually transition to a high fiber diet with a fiber supplement over the next few weeks after discharge.  Start with a pureed / full liquid diet (see below)  WALK Walk an hour a day.  Control your pain to do that.    CONTROL PAIN Control pain so that you can walk, sleep, tolerate sneezing/coughing, go up/down stairs.  HAVE A BOWEL MOVEMENT DAILY Keep your bowels regular to avoid problems.  OK to try a laxative to override constipation.  OK to use an antidairrheal to slow down diarrhea.  Call if not better after 2 tries  CALL IF YOU HAVE PROBLEMS/CONCERNS Call if you are still struggling despite following these instructions. Call if you have concerns not answered by these instructions  ######################################################################    1. DIET: Follow a light bland diet the first 24 hours after arrival home, such as soup, liquids, crackers, etc.  Be sure to include lots of fluids daily.  Avoid fast food or heavy meals as your are more likely to get nauseated.  Eat a low fat the next few days after surgery.   2. Take your usually prescribed home medications unless otherwise directed. 3. PAIN CONTROL: a. Pain is best controlled by a usual combination of three different methods TOGETHER: i. Ice/Heat ii. Over the counter pain medication iii. Prescription pain medication b. Most patients will experience some swelling and bruising around the incisions.  Ice packs or heating pads (30-60 minutes up to 6 times a day) will help. Use ice for the first few days to help decrease swelling and bruising, then switch to heat to help relax tight/sore spots and speed recovery.  Some people prefer to use ice alone, heat alone, alternating between ice & heat.  Experiment to what works for you.  Swelling and bruising can take several weeks to resolve.   c. It is  helpful to take an over-the-counter pain medication regularly for the first few weeks.  Choose one of the following that works best for you: i. Naproxen (Aleve, etc)  Two 251m tabs twice a day ii. Ibuprofen (Advil, etc) Three 2053mtabs four times a day (every meal & bedtime) iii. Acetaminophen (Tylenol, etc) 500-65044mour times a day (every meal & bedtime) d. A  prescription for pain medication (such as oxycodone, hydrocodone, etc) should be given to you upon discharge.  Take your pain medication as prescribed.  i. If you are having problems/concerns with the prescription medicine (does not control pain, nausea, vomiting, rash, itching, etc), please call us [phone] see if we need to switch you to a different pain medicine that will work better for you and/or control your side effect better. ii. If you need a refill on your pain medication, please contact your pharmacy.  They will contact our office to request authorization. Prescriptions will not be filled after 5 pm or on week-ends. 4. Avoid getting constipated.  Between the surgery and the pain medications, it is common to experience some constipation.  Increasing fluid intake and taking a fiber supplement (such as Metamucil, Citrucel, FiberCon, MiraLax, etc) 1-2 times a day regularly will usually help prevent this problem from occurring.  A mild laxative (prune juice, Milk of Magnesia, MiraLax, etc) should be taken according to package directions if there are no bowel movements after 48 hours.   5. Watch out for diarrhea.  If you have many loose bowel movements, simplify your diet to bland foods & liquids for  a few days.  Stop any stool softeners and decrease your fiber supplement.  Switching to mild anti-diarrheal medications (Kayopectate, Pepto Bismol) can help.  If this worsens or does not improve, please call us. 6. Wash / shower every day.  You may shower over the dressings as they are waterproof.  Continue to shower over incision(s)  after the dressing is off. 7. Remove your waterproof bandages 5 days after surgery.  You may leave the incision open to air.  You may replace a dressing/Band-Aid to cover the incision for comfort if you wish.  8. ACTIVITIES as tolerated:   a. You may resume regular (light) daily activities beginning the next day--such as daily self-care, walking, climbing stairs--gradually increasing activities as tolerated.  If you can walk 30 minutes without difficulty, it is safe to try more intense activity such as jogging, treadmill, bicycling, low-impact aerobics, swimming, etc. b. Save the most intensive and strenuous activity for last such as sit-ups, heavy lifting, contact sports, etc  Refrain from any heavy lifting or straining until you are off narcotics for pain control.   c. DO NOT PUSH THROUGH PAIN.  Let pain be your guide: If it hurts to do something, don't do it.  Pain is your body warning you to avoid that activity for another week until the pain goes down. d. You may drive when you are no longer taking prescription pain medication, you can comfortably wear a seatbelt, and you can safely maneuver your car and apply brakes. e. Dennis Bast may have sexual intercourse when it is comfortable.  9. FOLLOW UP in our office a. Please call [facility] at [phone] to set up an appointment to see your surgeon in the office for a follow-up appointment approximately 2-3 weeks after your surgery. b. Make sure that you call for this appointment the day you arrive home to insure a convenient appointment time. 10. IF YOU HAVE DISABILITY OR FAMILY LEAVE FORMS, BRING THEM TO THE OFFICE FOR PROCESSING.  DO NOT GIVE THEM TO YOUR DOCTOR.   WHEN TO CALL us [phone]: 1. Poor pain control 2. Reactions / problems with new medications (rash/itching, nausea, etc)  3. Fever over 101.5 F (38.5 C) 4. Inability to urinate 5. Nausea and/or vomiting 6. Worsening swelling or bruising 7. Continued bleeding from incision. 8. Increased  pain, redness, or drainage from the incision   The clinic staff is available to answer your questions during regular business hours (8:30am-5pm).  Please dont hesitate to call and ask to speak to one of our nurses for clinical concerns.   If you have a medical emergency, go to the nearest emergency room or call 911.  A surgeon from [facility] is always on call at the [facility], [place], [place], [place], [place]  [address] ? MAIN: [phone] ? TOLL FREE: [phone] ?  FAX [phone] [email or web address]  Laparoscopic Splenectomy, Care After Refer to this sheet in the next few weeks. These instructions provide you with information about caring for yourself after your procedure. Your health care provider may also give you more specific instructions. Your treatment has been planned according to current medical practices, but problems sometimes occur. Call your health care provider if you have any problems or questions after your procedure. What can I expect after the procedure? After the procedure, it is common to have:  Mild pain.  Lack of energy.  Follow these instructions at home: Medicines  Take medicines only  as directed by your health care provider.  Do not drive or operate heavy machinery while taking pain medicine.  Talk with your health care provider about the need for vaccinations to help prevent infections. Incision care  There are many different ways to close and cover an incision, including stitches (sutures), skin glue, and adhesive strips. Follow your health care provider's instructions about: ? Incision care. ? Bandage (dressing) changes and removal. ? Incision closure removal.  Check the incision area every day for signs of infection. Watch for: ? Redness, swelling, or pain. ? Fluid , blood, or pus.  Do not take baths, swim, or use a hot tub until your health care provider approves.  Follow your health care provider's instructions about showering or bathing. Eating and drinking  Drink enough fluid to keep your urine clear or pale yellow.  Return to your normal diet as directed by your health care provider. Activity  Return to your normal activities as directed by your health care provider. Ask your health care provider what activities are safe for you.  Avoid strenuous activity.  Do not lift anything that is heavier than 10 lb (4.5 kg).  Walk as much as possible.  Ask your health care provider when it is safe to drive, have sex, or go back to work. General instructions  Continue to practice deep breathing as directed by your health care provider.  Keep all follow-up visits as directed by your health care provider. This is important. Contact a health care provider if:  You have pain that is not helped by medicine.  You have redness, swelling, or pain in any incision areas.  You have fluid, blood, or pus coming from your incisions.  You have a fever.  You have a sore throat.  You have chills.  You have nausea or vomiting. Get help right away if:  You have trouble breathing.  Your legs are red, swollen, or painful.  You suddenly feel very weak or dizzy.  You have chest pain. This information is not intended to replace advice given to you by your health care provider. Make sure you discuss any questions you have with your health care provider. Document Released: 09/08/2004 Document Revised: 07/28/2015 Document Reviewed: 12/24/2013 Elsevier Interactive Patient Education  2018 [employer].

## 2017-04-18 ENCOUNTER — Encounter (HOSPITAL_COMMUNITY): Payer: Self-pay | Admitting: Surgery

## 2017-04-18 MED ORDER — IBUPROFEN 200 MG PO TABS: 600.0000 mg | ORAL_TABLET | Freq: Four times a day (QID) | ORAL | Status: DC | PRN

## 2017-04-18 MED ORDER — ALUM & MAG HYDROXIDE-SIMETH 200-200-20 MG/5ML PO SUSP: 30.0000 mL | Freq: Four times a day (QID) | ORAL | Status: DC | PRN

## 2017-04-18 NOTE — Progress Notes (Signed)
Discharge instructions reviewed with patient and husband. Patient and husband verbalize understanding.  Patient transported by NT to personal vehicle.

## 2017-04-18 NOTE — Discharge Summary (Signed)
Physician Discharge Summary  Patient ID: Holly Golden MRN: [id number] DOB/AGE: 47-Sep-1972  47 y.o.  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  SURGERY:  04/17/2017  Procedure(s): LAPAROSCOPIC SPLENECTOMY  SURGEON:  Michael Boston, MD  Consults: None  Hospital Course:   The patient underwent the surgery above.  Postoperatively, the patient gradually mobilized and advanced to a solid diet.  Pain and other symptoms were treated aggressively.    By the time of discharge, the patient was walking well the hallways, eating food, having flatus.  Pain was well-controlled on an oral medications.  Based on meeting discharge criteria and continuing to recover, I felt it was safe for the patient to be discharged from the hospital to further recover with close followup. Postoperative recommendations were discussed in detail.  They are written as well.  Discharged Condition: good  Disposition:  Follow-up Information    Michael Boston, MD. Schedule an appointment as soon as possible for a visit in 3 weeks.   Specialty:  General Surgery Why:  To follow up after your operation, To follow up after your hospital stay Contact information: [address] [place] [address] [phone]           01-Home or Self Care  Discharge Instructions    Call MD for:   Complete by:  As directed    FEVER > 101.5 F  (temperatures < 101.5 F  are not significant)   Call MD for:   Complete by:  As directed    FEVER > 101.5 F  (temperatures < 101.5 F are not significant)   Call MD for:  extreme fatigue   Complete by:  As directed    Call MD for:  extreme fatigue   Complete by:  As directed    Call MD for:  persistant dizziness or light-headedness   Complete by:  As directed    Call MD for:  persistant dizziness or light-headedness   Complete by:  As directed    Call MD for:  persistant nausea and vomiting   Complete by:  As directed    Call MD for:  persistant nausea and vomiting   Complete by:  As directed    Call MD for:  redness, tenderness, or signs of infection (pain, swelling, redness, odor or green/yellow discharge around incision site)   Complete by:  As directed    Call MD for:  redness, tenderness, or signs of infection (pain, swelling, redness, odor or green/yellow discharge around incision site)   Complete by:  As directed    Call MD for:  severe uncontrolled pain   Complete by:  As directed    Call MD for:  severe uncontrolled pain   Complete by:  As directed    Diet - low sodium heart healthy   Complete by:  As directed    Follow a light diet the first few days at home.   Start with a bland diet such as soups, liquids, starchy foods, low fat foods, etc.   If you  feel full, bloated, or constipated, stay on a full liquid or pureed/blenderized diet for a few days until you feel better and no longer constipated. Be sure to drink plenty of fluids every day to avoid getting dehydrated (feeling dizzy, not urinating, etc.). Gradually add a fiber supplement to your diet   Diet - low sodium heart healthy   Complete by:  As directed    Follow a light diet the first few days at home.   Start with a bland diet such as soups, liquids, starchy foods, low fat foods, etc.   If you feel full, bloated, or constipated, stay on a full liquid or pureed/blenderized diet for a few days until you feel better and no longer  constipated. Be sure to drink plenty of fluids every day to avoid getting dehydrated (feeling dizzy, not urinating, etc.). Gradually add a fiber supplement to your diet   Discharge instructions   Complete by:  As directed    See Discharge Instructions If you are not getting better after two weeks or are noticing you are getting worse, contact our office [phone] for further advice.  We may need to adjust your medications, re-evaluate you in the office, send you to the emergency room, or see what other things we can do to help. The clinic staff is available to answer your questions during regular business hours (8:30am-5pm).  Please don't hesitate to call and ask to speak to one of our nurses for clinical concerns.    A surgeon from [facility] is always on call at the hospitals 24 hours/day If you have a medical emergency, go to the nearest emergency room or call 911.   Discharge instructions   Complete by:  As directed    See Discharge Instructions If you are not getting better after two weeks or are noticing you are getting worse, contact our office [phone] for further advice.  We may need to adjust your medications, re-evaluate you in the office, send you to the emergency room, or see what other things we can do to help. The clinic staff is available to answer your questions during regular business hours (8:30am-5pm).  Please don't hesitate to call and ask to speak to one of our nurses for clinical concerns.    A surgeon from [facility] is always on call at the hospitals 24 hours/day If you have a medical emergency, go to the nearest emergency room or call 911.   Driving Restrictions   Complete by:  As directed    You may drive when you are no longer taking narcotic prescription pain medication, you can comfortably wear a seatbelt, and you can safely make sudden turns/stops to protect yourself without hesitating due to pain.   Driving Restrictions    Complete by:  As directed    You may drive when you are no longer taking narcotic prescription pain medication, you can comfortably wear a seatbelt, and you can safely make sudden turns/stops to protect yourself without hesitating due to pain.   Increase activity slowly   Complete by:  As directed    Start light daily activities --- self-care, walking, climbing stairs- beginning the day after surgery.  Gradually increase activities as tolerated.  Control your pain to be active.  Stop when you are tired.  Ideally, walk several times a day, eventually an hour a day.   Most people are back to most day-to-day activities in a few weeks.  It takes 4-8 weeks to get  back to unrestricted, intense activity. If you can walk 30 minutes without difficulty, it is safe to try more intense activity such as jogging, treadmill, bicycling, low-impact aerobics, swimming, etc. Save the most intensive and strenuous activity for last (Usually 4-8 weeks after surgery) such as sit-ups, heavy lifting, contact sports, etc.  Refrain from any intense heavy lifting or straining until you are off narcotics for pain control.  You will have off days, but things should improve week-by-week. DO NOT PUSH THROUGH PAIN.  Let pain be your guide: If it hurts to do something, don't do it.  Pain is your body warning you to avoid that activity for another week until the pain goes down.   Increase activity slowly   Complete by:  As directed    Start light daily activities --- self-care, walking, climbing stairs- beginning the day after surgery.  Gradually increase activities as tolerated.  Control your pain to be active.  Stop when you are tired.  Ideally, walk several times a day, eventually an hour a day.   Most people are back to most day-to-day activities in a few weeks.  It takes 4-8 weeks to get back to unrestricted, intense activity. If you can walk 30 minutes without difficulty, it is safe to try more intense activity such as jogging,  treadmill, bicycling, low-impact aerobics, swimming, etc. Save the most intensive and strenuous activity for last (Usually 4-8 weeks after surgery) such as sit-ups, heavy lifting, contact sports, etc.  Refrain from any intense heavy lifting or straining until you are off narcotics for pain control.  You will have off days, but things should improve week-by-week. DO NOT PUSH THROUGH PAIN.  Let pain be your guide: If it hurts to do something, don't do it.  Pain is your body warning you to avoid that activity for another week until the pain goes down.   Lifting restrictions   Complete by:  As directed    If you can walk 30 minutes without difficulty, it is safe to try more intense activity such as jogging, treadmill, bicycling, low-impact aerobics, swimming, etc. Save the most intensive and strenuous activity for last (Usually 4-8 weeks after surgery) such as sit-ups, heavy lifting, contact sports, etc.  Refrain from any intense heavy lifting or straining until you are off narcotics for pain control.  You will have off days, but things should improve week-by-week. DO NOT PUSH THROUGH PAIN.  Let pain be your guide: If it hurts to do something, don't do it.  Pain is your body warning you to avoid that activity for another week until the pain goes down.   Lifting restrictions   Complete by:  As directed    If you can walk 30 minutes without difficulty, it is safe to try more intense activity such as jogging, treadmill, bicycling, low-impact aerobics, swimming, etc. Save the most intensive and strenuous activity for last (Usually 4-8 weeks after surgery) such as sit-ups, heavy lifting, contact sports, etc.  Refrain from any intense heavy lifting or straining until you are off narcotics for pain control.  You will have off days, but things should improve week-by-week. DO NOT PUSH THROUGH PAIN.  Let pain be your guide: If it hurts to do something, don't do it.  Pain is your body warning you to avoid that activity  for another week until the pain goes down.   May walk up steps   Complete by:  As directed    May walk up steps   Complete  by:  As directed    No wound care   Complete by:  As directed    It is good for closed incision and even open wounds to be washed every day.  Shower every day.  Short baths are fine.  Wash the incisions and wounds clean with soap & water.    If you have a closed incision(s), wash the incision with soap & water every day.  You may leave closed incisions open to air if it is dry.   You may cover the incision with clean gauze & replace it after your daily shower for comfort. If you have skin tapes (Steristrips) or skin glue (Dermabond) on your incision, leave them in place.  They will fall off on their own like a scab.  You may trim any edges that curl up with clean scissors.  If you have staples, set up an appointment for them to be removed in the office in 10 days after surgery.  If you have a drain, wash around the skin exit site with soap & water and place a new dressing of gauze or band aid around the skin every day.  Keep the drain site clean & dry.   No wound care   Complete by:  As directed    It is good for closed incision and even open wounds to be washed every day.  Shower every day.  Short baths are fine.  Wash the incisions and wounds clean with soap & water.    If you have a closed incision(s), wash the incision with soap & water every day.  You may leave closed incisions open to air if it is dry.   You may cover the incision with clean gauze & replace it after your daily shower for comfort. If you have skin tapes (Steristrips) or skin glue (Dermabond) on your incision, leave them in place.  They will fall off on their own like a scab.  You may trim any edges that curl up with clean scissors.  If you have staples, set up an appointment for them to be removed in the office in 10 days after surgery.  If you have a drain, wash around the skin exit site with soap & water  and place a new dressing of gauze or band aid around the skin every day.  Keep the drain site clean & dry.   Sexual Activity Restrictions   Complete by:  As directed    You may have sexual intercourse when it is comfortable. If it hurts to do something, stop.   Sexual Activity Restrictions   Complete by:  As directed    You may have sexual intercourse when it is comfortable. If it hurts to do something, stop.      Allergies as of 04/18/2017   No Known Allergies     Medication List    TAKE these medications   amLODipine 5 MG tablet Commonly known as:  NORVASC Take 1 tablet (5 mg total) by mouth daily.   BENEFIBER DRINK MIX Pack Take 4 g by mouth at bedtime. What changed:  additional instructions   cetirizine 10 MG tablet Commonly known as:  ZYRTEC Take 10 mg by mouth daily.   doxycycline 50 MG capsule Commonly known as:  VIBRAMYCIN Take 50 mg by mouth daily at 2 PM.   finasteride 5 MG tablet Commonly known as:  PROSCAR Take 2.5 mg by mouth every evening.   fluticasone 50 MCG/ACT nasal spray Commonly known as:  FLONASE Place 2 sprays into both nostrils daily as needed for allergies or rhinitis.   HAIR SKIN & NAILS GUMMIES PO Take 3 each by mouth daily.   ibuprofen 200 MG tablet Commonly known as:  ADVIL,MOTRIN Take 600 mg by mouth every 6 (six) hours as needed for headache or moderate pain.   metroNIDAZOLE 0.75 % gel Commonly known as:  METROGEL Apply 1 application topically every evening.   traMADol 50 MG tablet Commonly known as:  ULTRAM Take 1-2 tablets (50-100 mg total) by mouth every 6 (six) hours as needed for moderate pain or severe pain.       Significant Diagnostic Studies:  No results found for this or any previous visit (from the past 72 hour(s)).  No results found.  Discharge Exam: Blood pressure (!) 96/58, pulse 72, temperature 98.6 F (37 C), temperature source Oral, resp. rate 18, height 5\' 3"  (1.6 m), weight 76.2 kg (168 lb), last  menstrual period 04/12/2017, SpO2 96 %.  General: Pt awake/alert/oriented x4 in No acute distress Eyes: PERRL, normal EOM.  Sclera clear.  No icterus Neuro: CN II-XII intact w/o focal sensory/motor deficits. Lymph: No head/neck/groin lymphadenopathy Psych:  No delerium/psychosis/paranoia HENT: Normocephalic, Mucus membranes moist.  No thrush Neck: Supple, No tracheal deviation Chest: No chest wall pain w good excursion CV:  Pulses intact.  Regular rhythm MS: Normal AROM mjr joints.  No obvious deformity Abdomen: Soft.  Nondistended.  Nontender.  No evidence of peritonitis.  No incarcerated hernias. Ext:  SCDs BLE.  No mjr edema.  No cyanosis Skin: No petechiae / purpura  Past Medical History:  Diagnosis Date  . Arthritis    MILD OA HANDS  . Bleeding gums 02/15/2015  . Breast nodule 08/15/2012   Left breast nodule at 2 oclock will get mammogram and [place]  . Elevated triglycerides with high cholesterol 07/14/2014  . Fatty liver   . Gall bladder disease   . GERD (gastroesophageal reflux disease)   . Hair loss   . Headache   . Hemorrhoids   . History of kidney stones   . Joint pain   . LUQ pain 02/15/2015  . 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 PONV (postoperative nausea and vomiting)   . Raynaud's disease   . Rectal bleeding   . Seasonal allergies   . Stress 07/14/2014  . Swelling    feet or hands  . Swelling of hand 11/17/2012   Has bilateral swelling in hands, and they are cold  . UTI (urinary tract infection)   . Varicose veins     Past Surgical History:  Procedure Laterality Date  . CESAREAN SECTION     1991  . CHOLECYSTECTOMY     2010 LAPROSCOPIC  . COLONOSCOPY  08/06/2011   Procedure: COLONOSCOPY;  Surgeon: Rogene Houston, MD;  Location: [facility];  Service: Endoscopy;  Laterality: N/A;  730  . COLONOSCOPY N/A 04/05/2017   Procedure: COLONOSCOPY;  Surgeon: Rogene Houston, MD;  Location: [facility];  Service: Endoscopy;  Laterality: N/A;  855-moved  to 940 per Ann  . ENDOVENOUS ABLATION SAPHENOUS VEIN W/ LASER Left 12-10-2013   EVLA  left small saphenous vein  and stab phlebectomy <10 Incision left leg  . ENDOVENOUS ABLATION SAPHENOUS VEIN W/ LASER Right 12-31-2013   right small saphenous vein  by Curt Jews MD  . LAPAROSCOPIC SPLENECTOMY N/A 04/17/2017   Procedure: LAPAROSCOPIC SPLENECTOMY;  Surgeon: Michael Boston, MD;  Location: [facility];  Service: General;  Laterality: N/A;  . left tube and ovary removed for a benign mass.    Marland Kitchen splenic hemangiomas      Social History   Socioeconomic History  . Marital status: Married    Spouse name: Audry Pili  . Number of children: 2  . Years of education: Not on file  . Highest education level: Not on file  Social Needs  . Financial resource strain: Not on file  . Food insecurity - worry: Not on file  . Food insecurity - inability: Not on file  . Transportation needs - medical: Not on file  . Transportation needs - non-medical: Not on file  Occupational History  . Occupation: LPN    Employer: [employer]  Tobacco Use  . Smoking status: Never Smoker  . Smokeless tobacco: Never Used  Substance and Sexual Activity  . Alcohol use: No  . Drug use: No  . Sexual activity: Not Currently    Birth control/protection: Surgical    Comment: husband had vasectomy  Other Topics Concern  . Not on file  Social History Narrative  . Not on file    Family History  Problem Relation Age of Onset  . Cancer Maternal Grandmother        pancreatic  . Cancer Paternal Grandmother        breast and colon  . Seizures Mother   . Hyperlipidemia Mother   . Thyroid disease Mother   . Anxiety disorder Mother   . Sleep apnea Mother        no longer on CPAP   . Obesity Mother   . Seizures Brother   . Depression Brother   . Stroke Paternal Grandfather   . Cirrhosis Maternal Grandfather   . Healthy Son   . Diabetes Other   . Healthy Son   . Cancer Cousin        colon, hodgkin's  . Diabetes Cousin      Current Facility-Administered Medications  Medication Dose Route Frequency Provider Last Rate Last Dose  . 0.9 %  sodium chloride infusion   Intravenous Continuous Michael Boston, MD 50 mL/hr at 04/17/17 1323    . acetaminophen (TYLENOL) tablet 1,000 mg  1,000 mg Oral Tor Netters, MD   1,000 mg at 04/18/17 1540  . alum & mag hydroxide-simeth (MAALOX/MYLANTA) 200-200-20 MG/5ML suspension 30 mL  30 mL Oral Q6H PRN Michael Boston, MD      . amLODipine (NORVASC) tablet 5 mg  5 mg Oral Daily Michael Boston, MD      . bisacodyl (DULCOLAX) suppository 10 mg  10 mg Rectal Daily PRN Michael Boston, MD      . diphenhydrAMINE (BENADRYL) 12.5 MG/5ML elixir 12.5 mg  12.5 mg Oral Q6H PRN Michael Boston, MD       Or  . diphenhydrAMINE (BENADRYL) injection 12.5 mg  12.5 mg Intravenous Q6H PRN Michael Boston, MD      . doxycycline (VIBRA-TABS) tablet 50 mg  50 mg Oral Q1400 Michael Boston, MD      . enoxaparin (LOVENOX) injection 40 mg  40 mg Subcutaneous Q24H Michael Boston, MD      . finasteride (PROSCAR) tablet 2.5 mg  2.5 mg Oral QPM Michael Boston, MD   2.5 mg at 04/17/17 1851  . fluticasone (FLONASE) 50 MCG/ACT nasal spray 2 spray  2 spray Each Nare Daily PRN Michael Boston, MD      . gabapentin (NEURONTIN) capsule 300 mg  300 mg Oral Ardeen Fillers, MD  300 mg at 04/17/17 2207  . guaiFENesin-dextromethorphan (ROBITUSSIN DM) 100-10 MG/5ML syrup 10 mL  10 mL Oral Q4H PRN Michael Boston, MD      . hydrALAZINE (APRESOLINE) injection 5-20 mg  5-20 mg Intravenous Q4H PRN Michael Boston, MD      . hydrocortisone (ANUSOL-HC) 2.5 % rectal cream 1 application  1 application Topical QID PRN Michael Boston, MD      . hydrocortisone cream 1 % 1 application  1 application Topical TID PRN Michael Boston, MD      . HYDROmorphone (DILAUDID) injection 0.5-2 mg  0.5-2 mg Intravenous Q2H PRN Michael Boston, MD      . ibuprofen (ADVIL,MOTRIN) tablet 600 mg  600 mg Oral Q6H PRN Michael Boston, MD      . lactated ringers bolus  1,000 mL  1,000 mL Intravenous Q8H PRN Tylek Boney, Remo Lipps, MD      . lactated ringers infusion 1,000 mL  1,000 mL Intravenous Q8H PRN Delio Slates, Remo Lipps, MD      . lip balm (CARMEX) ointment 1 application  1 application Topical BID Michael Boston, MD   1 application at 16/10/96 2207  . loratadine (CLARITIN) tablet 10 mg  10 mg Oral Daily Michael Boston, MD      . magic mouthwash  15 mL Oral QID PRN Michael Boston, MD      . menthol-cetylpyridinium (CEPACOL) lozenge 3 mg  1 lozenge Oral PRN Michael Boston, MD      . methocarbamol (ROBAXIN) 1,000 mg in dextrose 5 % 50 mL IVPB  1,000 mg Intravenous Q6H PRN Michael Boston, MD   Stopped at 04/17/17 1129  . methocarbamol (ROBAXIN) tablet 1,000 mg  1,000 mg Oral Q6H PRN Michael Boston, MD      . metoprolol tartrate (LOPRESSOR) injection 5 mg  5 mg Intravenous Q6H PRN Michael Boston, MD      . metroNIDAZOLE (METROGEL) 0.45 % gel 1 application  1 application Topical QPM Michael Boston, MD   1 application at 40/98/11 1851  . ondansetron (ZOFRAN-ODT) disintegrating tablet 4 mg  4 mg Oral Q6H PRN Michael Boston, MD       Or  . ondansetron (ZOFRAN) injection 4 mg  4 mg Intravenous Q6H PRN Michael Boston, MD      . oxyCODONE (Oxy IR/ROXICODONE) immediate release tablet 5-10 mg  5-10 mg Oral Q4H PRN Michael Boston, MD      . phenol (CHLORASEPTIC) mouth spray 1-2 spray  1-2 spray Mouth/Throat PRN Michael Boston, MD      . polyethylene glycol (MIRALAX / GLYCOLAX) packet 17 g  17 g Oral Daily PRN Michael Boston, MD      . prochlorperazine (COMPAZINE) tablet 10 mg  10 mg Oral Q6H PRN Michael Boston, MD       Or  . prochlorperazine (COMPAZINE) injection 5-10 mg  5-10 mg Intravenous Q6H PRN Michael Boston, MD      . protein supplement (RESOURCE BENEPROTEIN) powder packet 6 g  1 scoop Oral Ardeen Fillers, MD      . simethicone (MYLICON) chewable tablet 40 mg  40 mg Oral Q6H PRN Michael Boston, MD      . traMADol Veatrice Bourbon) tablet 50-100 mg  50-100 mg Oral Q6H PRN Michael Boston, MD      .  zolpidem (AMBIEN) tablet 5 mg  5 mg Oral QHS PRN Michael Boston, MD         No Known Allergies  Signed: Morton Peters, M.D., F.A.C.S. Gastrointestinal  and [facility]imally Invasive [facility] [address], [place], [place] [address] [phone] Main / Paging   04/18/2017, 7:31 AM

## 2017-06-17 DIAGNOSIS — L728 Other follicular cysts of the skin and subcutaneous tissue: Secondary | ICD-10-CM | POA: Diagnosis not present

## 2017-06-17 DIAGNOSIS — L719 Rosacea, unspecified: Secondary | ICD-10-CM | POA: Diagnosis not present

## 2017-06-17 DIAGNOSIS — L739 Follicular disorder, unspecified: Secondary | ICD-10-CM | POA: Diagnosis not present

## 2017-09-17 ENCOUNTER — Telehealth: Payer: Self-pay | Admitting: *Deleted

## 2017-09-19 DIAGNOSIS — R0600 Dyspnea, unspecified: Secondary | ICD-10-CM | POA: Diagnosis not present

## 2017-09-19 DIAGNOSIS — Z9081 Acquired absence of spleen: Secondary | ICD-10-CM | POA: Diagnosis not present

## 2017-09-19 DIAGNOSIS — D1803 Hemangioma of intra-abdominal structures: Secondary | ICD-10-CM | POA: Diagnosis not present

## 2017-09-19 DIAGNOSIS — I7300 Raynaud's syndrome without gangrene: Secondary | ICD-10-CM | POA: Diagnosis not present

## 2017-10-28 DIAGNOSIS — M79645 Pain in left finger(s): Secondary | ICD-10-CM | POA: Diagnosis not present

## 2017-10-28 DIAGNOSIS — M7989 Other specified soft tissue disorders: Secondary | ICD-10-CM | POA: Diagnosis not present

## 2018-01-08 DIAGNOSIS — R5383 Other fatigue: Secondary | ICD-10-CM | POA: Diagnosis not present

## 2018-03-10 MED FILL — metroNIDAZOLE 0.75 % CREA: 0.75 | 30 days supply | Qty: 45 | Fill #0

## 2018-03-10 MED FILL — FINASTERIDE 5 MG TABLET: 5 | 30 days supply | Qty: 15 | Fill #0

## 2018-03-10 MED FILL — DOXYCYCLINE HYC 50 MG CAP: 50 | 30 days supply | Qty: 30 | Fill #0

## 2018-03-21 NOTE — Progress Notes (Deleted)
Office Visit Note  Patient: Holly Golden             Date of Birth: 1970/10/23           MRN: [id number]             PCP: Rory Percy, MD Referring: Rory Percy, MD Visit Date: 04/04/2018 Occupation: @GUAROCC @  Subjective:  No chief complaint on file.   History of Present Illness: Holly Golden is a 48 y.o. female ***   Activities of Daily Living:  Patient reports morning stiffness for *** {minute/hour:19697}.   Patient {ACTIONS;DENIES/REPORTS:21021675::"Denies"} nocturnal pain.  Difficulty dressing/grooming: {ACTIONS;DENIES/REPORTS:21021675::"Denies"} Difficulty climbing stairs: {ACTIONS;DENIES/REPORTS:21021675::"Denies"} Difficulty getting out of chair: {ACTIONS;DENIES/REPORTS:21021675::"Denies"} Difficulty using hands for taps, buttons, cutlery, and/or writing: {ACTIONS;DENIES/REPORTS:21021675::"Denies"}  No Rheumatology ROS completed.   PMFS History:  Patient Active Problem List   Diagnosis Date Noted  . Splenic masses s/p lap splenectomy 04/17/2017 04/17/2017  . Raynaud's disease without gangrene 04/01/2017  . Primary osteoarthritis of both hands 04/01/2017  . Screening for colorectal cancer 03/22/2017  . Elevated cholesterol 03/22/2017  . Family history of colon cancer 03/13/2017  . History of colonic polyps 03/13/2017  . LUQ pain 02/15/2015  . Bleeding gums 02/15/2015  . Elevated triglycerides with high cholesterol 07/14/2014  . Hair loss 07/14/2014  . Stress 07/14/2014  . Rectal bleeding 11/26/2013  . Varicose veins of lower extremities with other complications 10/93/2355  . Swelling of hand 11/17/2012  . Breast nodule 08/15/2012    Past Medical History:  Diagnosis Date  . Arthritis    MILD OA HANDS  . Bleeding gums 02/15/2015  . Breast nodule 08/15/2012   Left breast nodule at 2 oclock will get mammogram and [place]  . Elevated triglycerides with high cholesterol 07/14/2014  . Fatty liver   . Gall bladder disease   . GERD (gastroesophageal  reflux disease)   . Hair loss   . Headache   . Hemorrhoids   . History of kidney stones   . Joint pain   . LUQ pain 02/15/2015  . 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 PONV (postoperative nausea and vomiting)   . Raynaud's disease   . Rectal bleeding   . Seasonal allergies   . Stress 07/14/2014  . Swelling    feet or hands  . Swelling of hand 11/17/2012   Has bilateral swelling in hands, and they are cold  . UTI (urinary tract infection)   . Varicose veins     Family History  Problem Relation Age of Onset  . Cancer Maternal Grandmother        pancreatic  . Cancer Paternal Grandmother        breast and colon  . Seizures Mother   . Hyperlipidemia Mother   . Thyroid disease Mother   . Anxiety disorder Mother   . Sleep apnea Mother        no longer on CPAP   . Obesity Mother   . Seizures Brother   . Depression Brother   . Stroke Paternal Grandfather   . Cirrhosis Maternal Grandfather   . Healthy Son   . Diabetes Other   . Healthy Son   . Cancer Cousin        colon, hodgkin's  . Diabetes Cousin    Past Surgical History:  Procedure Laterality Date  . CESAREAN SECTION     1991  . CHOLECYSTECTOMY     2010 LAPROSCOPIC  . COLONOSCOPY  08/06/2011   Procedure:  COLONOSCOPY;  Surgeon: Rogene Houston, MD;  [facility]: [facility];  Service: Endoscopy;  Laterality: N/A;  730  . COLONOSCOPY N/A 04/05/2017   Procedure: COLONOSCOPY;  Surgeon: Rogene Houston, MD;  Location: [facility];  Service: Endoscopy;  Laterality: N/A;  855-moved to 940 per Ann  . ENDOVENOUS ABLATION SAPHENOUS VEIN W/ LASER Left 12-10-2013   EVLA  left small saphenous vein  and stab phlebectomy <10 Incision left leg  . ENDOVENOUS ABLATION SAPHENOUS VEIN W/ LASER Right 12-31-2013   right small saphenous vein  by Curt Jews MD  . LAPAROSCOPIC SPLENECTOMY N/A 04/17/2017   Procedure: LAPAROSCOPIC SPLENECTOMY;  Surgeon: Michael Boston, MD;  Location: [facility];  Service: General;  Laterality: N/A;  . left  tube and ovary removed for a benign mass.    Marland Kitchen splenic hemangiomas     Social History   Social History Narrative  . Not on file   Immunization History  Administered Date(s) Administered  . Influenza-Unspecified 01/18/2015     Objective: Vital Signs: There were no vitals taken for this visit.   Physical Exam   Musculoskeletal Exam: ***  CDAI Exam: CDAI Score: Not documented Patient Global Assessment: Not documented; Provider Global Assessment: Not documented Swollen: Not documented; Tender: Not documented Joint Exam   Not documented   There is currently no information documented on the homunculus. Go to the Rheumatology activity and complete the homunculus joint exam.  Investigation: No additional findings.  Imaging: No results found.  Recent Labs: Lab Results  Component Value Date   WBC 4.4 04/12/2017   HGB 13.6 04/12/2017   PLT 186 04/12/2017   NA 139 04/12/2017   K 4.1 04/12/2017   CL 108 04/12/2017   CO2 25 04/12/2017   GLUCOSE 94 04/12/2017   BUN 11 04/12/2017   CREATININE 0.68 04/12/2017   BILITOT 0.4 03/22/2017   ALKPHOS 66 03/22/2017   AST 15 03/22/2017   ALT 12 03/22/2017   PROT 7.5 03/22/2017   ALBUMIN 4.8 03/22/2017   CALCIUM 9.2 04/12/2017   GFRAA >60 04/12/2017    Speciality Comments: No specialty comments available.  Procedures:  No procedures performed Allergies: Patient has no known allergies.   Assessment / Plan:     Visit Diagnoses: No diagnosis found.   Orders: No orders of the defined types were placed in this encounter.  No orders of the defined types were placed in this encounter.   Face-to-face time spent with patient was *** minutes. Greater than 50% of time was spent in counseling and coordination of care.  Follow-Up Instructions: No follow-ups on file.   Earnestine Mealing, CMA  Note - This record has been created using Editor, commissioning.  Chart creation errors have been sought, but may not always  have been  located. Such creation errors do not reflect on  the standard of medical care.

## 2018-04-04 ENCOUNTER — Ambulatory Visit: Payer: 59 | Admitting: Rheumatology

## 2018-05-05 NOTE — Telephone Encounter (Signed)
Note sent to nurse. 

## 2018-05-19 MED FILL — FINASTERIDE 5 MG TABLET: 5 | 90 days supply | Qty: 45 | Fill #1

## 2018-05-19 MED FILL — DOXYCYCLINE HYC 50 MG CAP: 50 | 90 days supply | Qty: 90 | Fill #1

## 2018-08-28 MED FILL — FINASTERIDE 5 MG TABLET: 5 | 90 days supply | Qty: 45 | Fill #0

## 2018-08-28 MED FILL — DOXYCYCLINE HYC 50 MG CAP: 50 | 90 days supply | Qty: 90 | Fill #0

## 2018-09-18 ENCOUNTER — Other Ambulatory Visit (HOSPITAL_COMMUNITY): Payer: Self-pay | Admitting: Adult Health

## 2018-09-18 DIAGNOSIS — Z1231 Encounter for screening mammogram for malignant neoplasm of breast: Secondary | ICD-10-CM

## 2018-10-09 ENCOUNTER — Ambulatory Visit (INDEPENDENT_AMBULATORY_CARE_PROVIDER_SITE_OTHER): Payer: No Typology Code available for payment source | Admitting: Adult Health

## 2018-10-09 ENCOUNTER — Other Ambulatory Visit: Payer: Self-pay

## 2018-10-09 ENCOUNTER — Encounter: Payer: Self-pay | Admitting: Adult Health

## 2018-10-09 VITALS — BP 128/89 | HR 71 | Ht 63.75 in | Wt 178.5 lb

## 2018-10-09 DIAGNOSIS — E782 Mixed hyperlipidemia: Secondary | ICD-10-CM

## 2018-10-09 DIAGNOSIS — Z01419 Encounter for gynecological examination (general) (routine) without abnormal findings: Secondary | ICD-10-CM

## 2018-10-09 DIAGNOSIS — Z1212 Encounter for screening for malignant neoplasm of rectum: Secondary | ICD-10-CM

## 2018-10-09 DIAGNOSIS — Z1211 Encounter for screening for malignant neoplasm of colon: Secondary | ICD-10-CM | POA: Diagnosis not present

## 2018-10-09 LAB — HEMOCCULT GUIAC POC 1CARD (OFFICE): Fecal Occult Blood, POC: NEGATIVE

## 2018-10-09 NOTE — Progress Notes (Signed)
Patient ID: Holly Golden, female   DOB: 1970/05/13, 48 y.o.   MRN: [id number] History of Present Illness: Holly Golden is a 48 year old white female, married, G2P2 in for a well woman gyn exam, she had a normal pap  With negative HPV 03/22/17. She has started missing periods and cycle irregular.  She requests fasting labs. TSH was 5.2 09/03/17 and 3.7 01/22/18 at PCP.  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.Marland Kitchen  PCP is Dr Nadara Mustard    Current Medications, Allergies, Past Medical History, Past Surgical History, Family History and Social History were reviewed in [facility] record.     Review of Systems: Patient denies any headaches, hearing loss, fatigue, blurred vision, shortness of breath, chest pain, abdominal pain, problems with bowel movements, urination, or intercourse(not active). No joint pain or mood swings. See HPI for positives.    Physical Exam:BP 128/89 (BP Location: Left Arm, Patient Position: Sitting, Cuff Size: Normal)   Pulse 71   Ht 5' 3.75" (1.619 m)   Wt 178 lb 8 oz (81 kg)   LMP 09/10/2018 (Approximate)   BMI 30.88 kg/m  General:  Well developed, well nourished, no acute distress Skin:  Warm and dry,tan Neck:  Midline trachea, normal thyroid, good ROM, no lymphadenopathy Lungs; Clear to auscultation bilaterally Breast:  No dominant palpable mass, retraction, or nipple discharge Cardiovascular: Regular rate and rhythm Abdomen:  Soft, non tender, no hepatosplenomegaly Pelvic:  External genitalia is normal in appearance, no lesions.  The vagina is normal in appearance. Urethra has no lesions or masses. The cervix is bulbous.  Uterus is felt to be normal size, shape, and contour.  No adnexal masses or tenderness noted.Bladder is non tender, no masses felt. Rectal: Good sphincter tone, no polyps, or hemorrhoids felt.  Hemoccult negative. Extremities/musculoskeletal:  No swelling or varicosities noted, no clubbing or cyanosis Psych:  No mood changes,  alert and cooperative,seems happy Fall risk is low PHQ 2 score 1 Examination chaperoned by Levy Pupa LPN.  Impression: 1. Encounter for well woman exam with routine gynecological exam   2. Screening for colorectal cancer   3. Elevated cholesterol with high triglycerides       Plan: Check CBC,CMP,TSH and lipids Mammogram yearly Physical in 1 year Pap in 2022 Colonoscopy at 50

## 2018-10-10 LAB — CBC
Hematocrit: 46.5 % (ref 34.0–46.6)
Hemoglobin: 15.3 g/dL (ref 11.1–15.9)
MCH: 30 pg (ref 26.6–33.0)
MCHC: 32.9 g/dL (ref 31.5–35.7)
MCV: 91 fL (ref 79–97)
Platelets: 538 10*3/uL — ABNORMAL HIGH (ref 150–450)
RBC: 5.1 x10E6/uL (ref 3.77–5.28)
RDW: 14.3 % (ref 11.7–15.4)
WBC: 6 10*3/uL (ref 3.4–10.8)

## 2018-10-10 LAB — COMPREHENSIVE METABOLIC PANEL
ALT: 10 IU/L (ref 0–32)
AST: 17 IU/L (ref 0–40)
Albumin/Globulin Ratio: 1.7 (ref 1.2–2.2)
Albumin: 4.6 g/dL (ref 3.8–4.8)
Alkaline Phosphatase: 98 IU/L (ref 39–117)
BUN/Creatinine Ratio: 13 (ref 9–23)
BUN: 11 mg/dL (ref 6–24)
Bilirubin Total: 0.3 mg/dL (ref 0.0–1.2)
CO2: 23 mmol/L (ref 20–29)
Calcium: 10.4 mg/dL — ABNORMAL HIGH (ref 8.7–10.2)
Chloride: 102 mmol/L (ref 96–106)
Creatinine, Ser: 0.88 mg/dL (ref 0.57–1.00)
GFR calc Af Amer: 90 mL/min/{1.73_m2} (ref >59)
GFR calc non Af Amer: 78 mL/min/{1.73_m2} (ref >59)
Globulin, Total: 2.7 g/dL (ref 1.5–4.5)
Glucose: 90 mg/dL (ref 65–99)
Potassium: 5.2 mmol/L (ref 3.5–5.2)
Sodium: 139 mmol/L (ref 134–144)
Total Protein: 7.3 g/dL (ref 6.0–8.5)

## 2018-10-10 LAB — LIPID PANEL
Chol/HDL Ratio: 6.3 ratio — ABNORMAL HIGH (ref 0.0–4.4)
Cholesterol, Total: 221 mg/dL — ABNORMAL HIGH (ref 100–199)
HDL: 35 mg/dL — ABNORMAL LOW (ref >39)
LDL Calculated: 134 mg/dL — ABNORMAL HIGH (ref 0–99)
Triglycerides: 262 mg/dL — ABNORMAL HIGH (ref 0–149)
VLDL Cholesterol Cal: 52 mg/dL — ABNORMAL HIGH (ref 5–40)

## 2018-10-10 LAB — TSH: TSH: 4.83 u[IU]/mL — ABNORMAL HIGH (ref 0.450–4.500)

## 2018-10-23 ENCOUNTER — Ambulatory Visit (HOSPITAL_COMMUNITY)
Admission: RE | Admit: 2018-10-23 | Discharge: 2018-10-23 | Disposition: A | Discharge status: Home / Self Care | Payer: No Typology Code available for payment source | Source: Ambulatory Visit | Attending: Adult Health | Admitting: Adult Health

## 2018-10-23 ENCOUNTER — Other Ambulatory Visit: Payer: Self-pay

## 2018-10-23 DIAGNOSIS — Z1231 Encounter for screening mammogram for malignant neoplasm of breast: Secondary | ICD-10-CM | POA: Diagnosis present

## 2018-12-10 ENCOUNTER — Other Ambulatory Visit: Payer: 59 | Admitting: Adult Health

## 2019-04-14 IMAGING — MG 2D DIGITAL SCREENING BILATERAL MAMMOGRAM WITH 3D TOMO WITH CAD
6 of 10 series · 6 of 26 positions shown · non-contrast
Comparison: Previous exam(s).

CLINICAL DATA: Screening.

EXAM:
2D DIGITAL SCREENING BILATERAL MAMMOGRAM WITH 3D TOMO WITH CAD

[L MLO (1 of 2)]
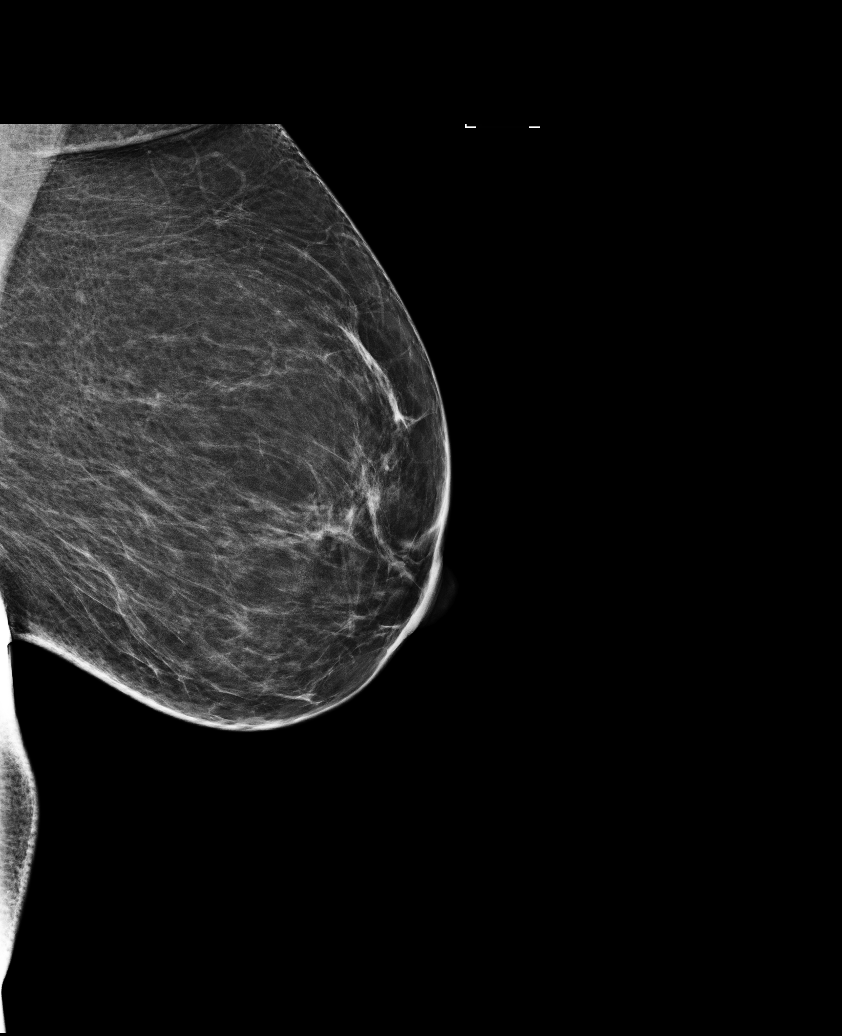

[R MLO (1 of 2)]
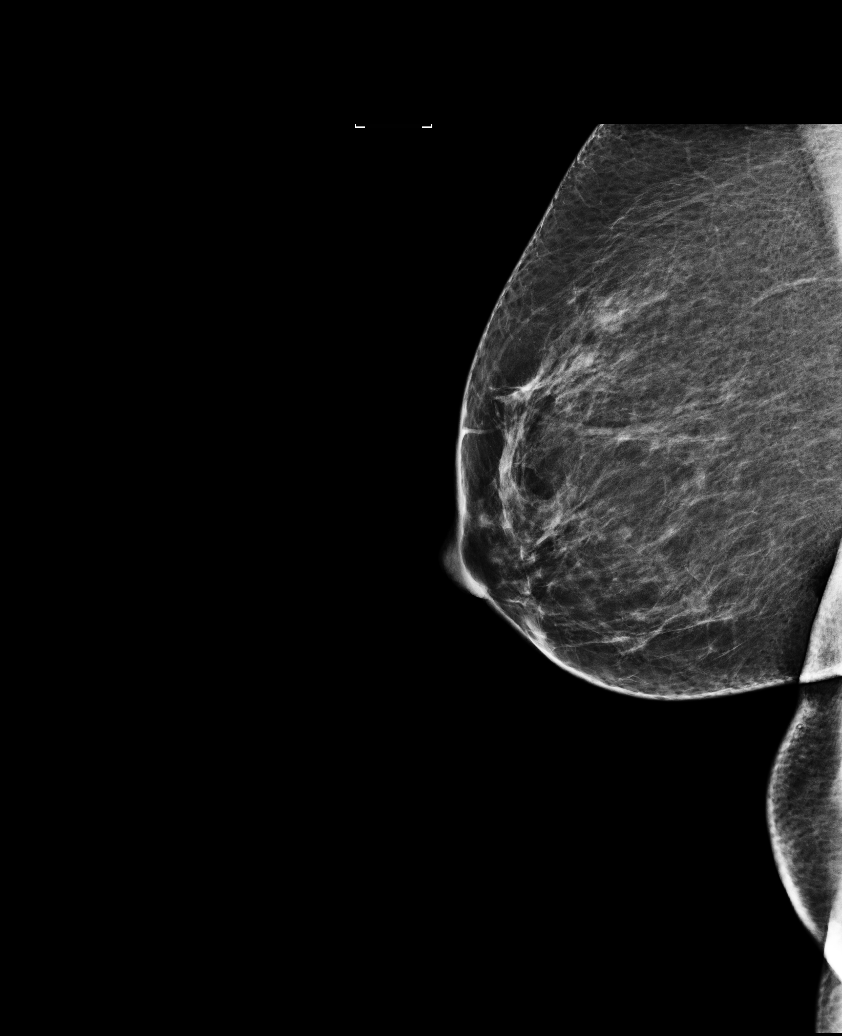

[R CC]
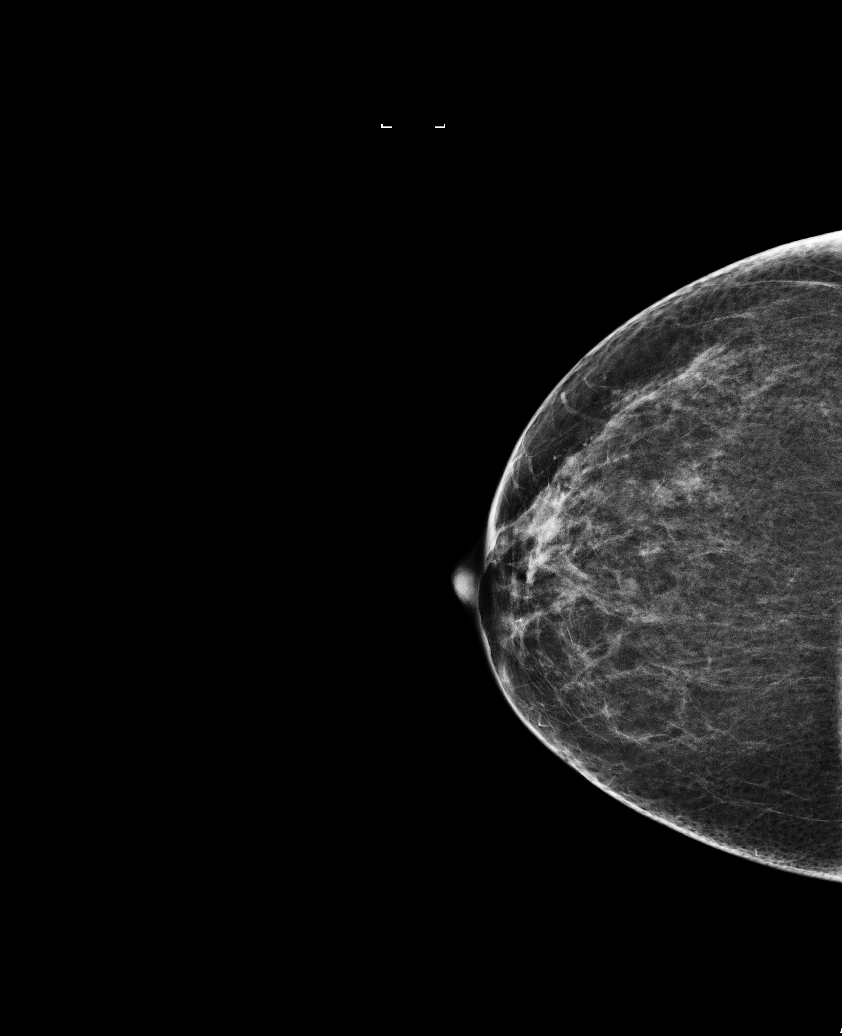

[L MLO (2 of 2)]
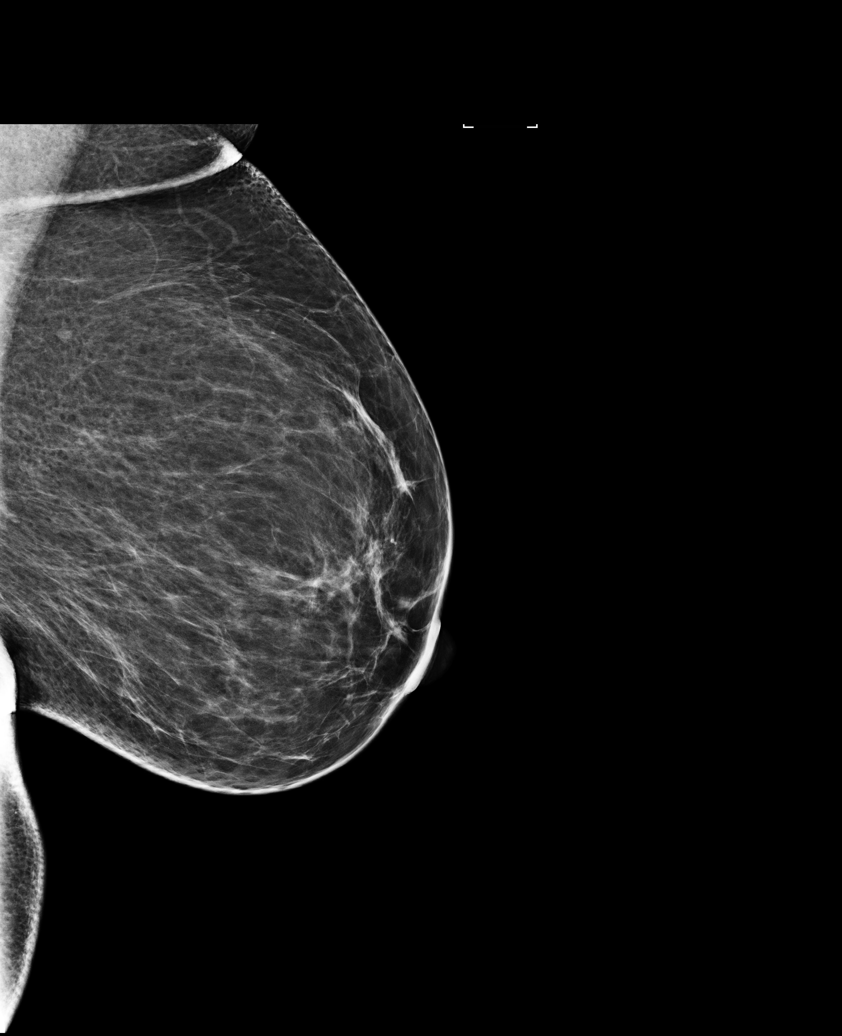

[R MLO (2 of 2)]
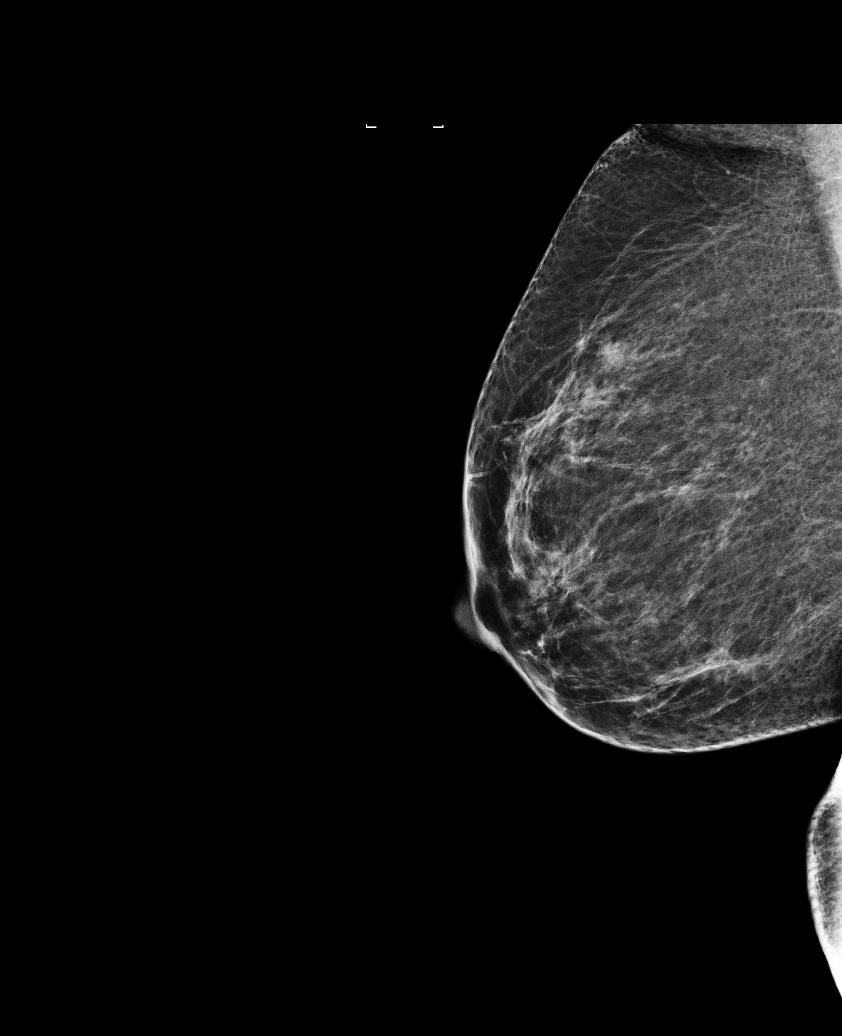

[L CC]
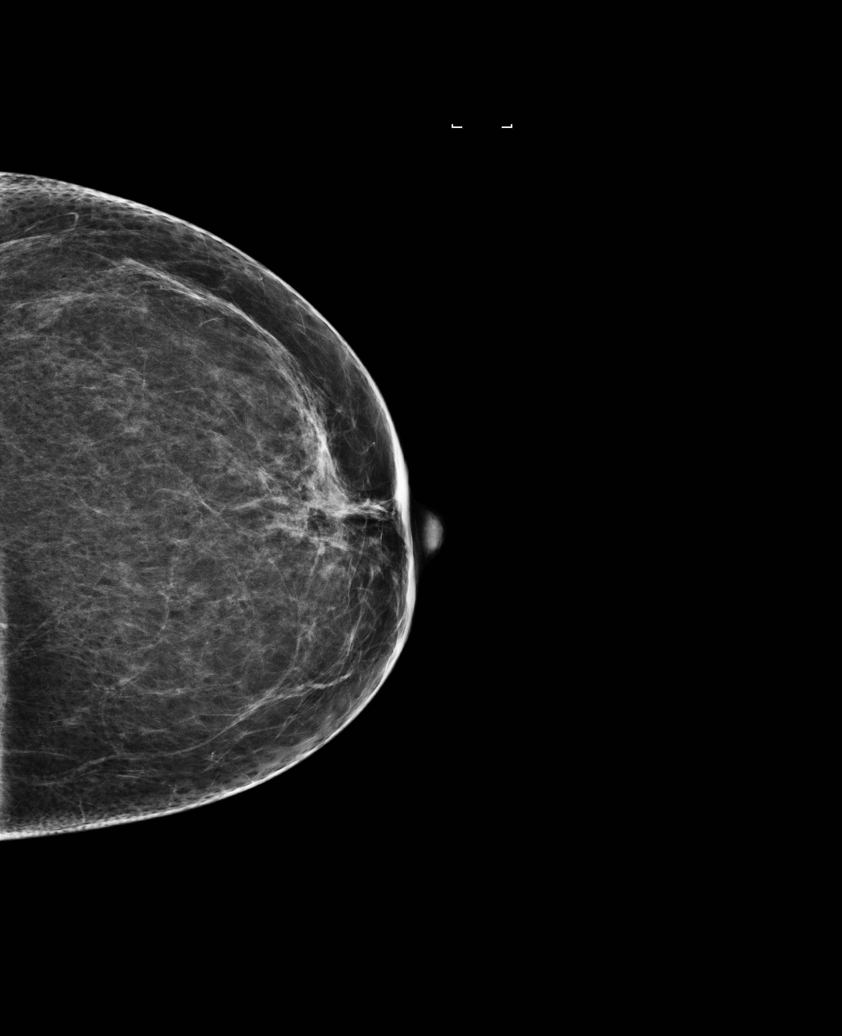

[6 of 26 positions shown; findings below may reference images not displayed]

ACR Breast Density Category b: There are scattered areas of
fibroglandular density.
FINDINGS: There are no findings suspicious for malignancy. Images were
processed with CAD.
IMPRESSION: No mammographic evidence of malignancy. A result letter of this
screening mammogram will be mailed directly to the patient.

RECOMMENDATION:
Screening mammogram in one year. (Code:[id number])

BI-RADS CATEGORY  1: Negative.

## 2019-10-14 ENCOUNTER — Other Ambulatory Visit (HOSPITAL_COMMUNITY): Payer: Self-pay | Admitting: Adult Health

## 2019-10-14 DIAGNOSIS — Z1231 Encounter for screening mammogram for malignant neoplasm of breast: Secondary | ICD-10-CM

## 2019-10-30 ENCOUNTER — Ambulatory Visit (HOSPITAL_COMMUNITY): Payer: No Typology Code available for payment source

## 2019-11-06 ENCOUNTER — Ambulatory Visit (HOSPITAL_COMMUNITY): Payer: 59

## 2019-11-27 ENCOUNTER — Ambulatory Visit (HOSPITAL_COMMUNITY): Payer: 59

## 2019-12-23 ENCOUNTER — Encounter: Payer: Self-pay | Admitting: Adult Health

## 2019-12-23 ENCOUNTER — Ambulatory Visit (INDEPENDENT_AMBULATORY_CARE_PROVIDER_SITE_OTHER): Payer: 59 | Admitting: Adult Health

## 2019-12-23 VITALS — BP 135/80 | HR 75 | Ht 63.0 in | Wt 176.0 lb

## 2019-12-23 DIAGNOSIS — N6321 Unspecified lump in the left breast, upper outer quadrant: Secondary | ICD-10-CM

## 2019-12-23 NOTE — Progress Notes (Signed)
°  Subjective:     Patient ID: Holly Golden, female   DOB: 1970/08/01, 49 y.o.   MRN: [id number]  HPI Holly Golden is a  49 year old white female,married, G2P2 in complaining of having found left breast mass 12/16/19,it is tender. She had second COVID vaccine 12/04/19. PCP is Dr Holly Golden   Review of Systems Left breast mass Has had chest pain, heaviness 4 times since getting second COVID vaccine Reviewed past medical,surgical, social and family history. Reviewed medications and allergies.     Objective:   Physical Exam BP 135/80 (BP Location: Left Arm, Patient Position: Sitting, Cuff Size: Normal)    Pulse 75    Ht 5\' 3"  (1.6 m)    Wt 176 lb (79.8 kg)    LMP 12/03/2019 (Approximate)    BMI 31.18 kg/m   Skin warm and dry,  Breasts:no dominate palpable mass, retraction or nipple discharge on the right, on the left, no retraction or nipple discharge, has 1 cm round tender mass at 2 0' clock, 4 FB from areola. No chest wall tenderness and no lymph nodes felt left under arm  Fall risk is low  Upstream - 12/23/19 1500      Pregnancy Intention Screening   Does the patient want to become pregnant in the next year? No    Does the patient's partner want to become pregnant in the next year? No    Would the patient like to discuss contraceptive options today? No      Contraception Wrap Up   Current Method Vasectomy    End Method Abstinence;Vasectomy    Contraception Counseling Provided No             Assessment:     1. Mass of upper outer quadrant of left breast Scheduled diagnostci mammogram and [place] [facility]30 at 11:20 am  - US BREAST LTD UNI RIGHT INC AXILLA; Future - MM DIAG BREAST TOMO BILATERAL; Future - US BREAST LTD UNI LEFT INC AXILLA; Future    Plan:     Return in 3 months for pap and physical

## 2020-01-22 ENCOUNTER — Ambulatory Visit (HOSPITAL_COMMUNITY): Payer: 59

## 2020-01-22 DIAGNOSIS — J309 Allergic rhinitis, unspecified: Secondary | ICD-10-CM | POA: Insufficient documentation

## 2020-01-22 DIAGNOSIS — R519 Headache, unspecified: Secondary | ICD-10-CM | POA: Insufficient documentation

## 2020-01-22 DIAGNOSIS — R430 Anosmia: Secondary | ICD-10-CM | POA: Insufficient documentation

## 2020-02-02 ENCOUNTER — Ambulatory Visit (HOSPITAL_COMMUNITY)
Admission: RE | Admit: 2020-02-02 | Discharge: 2020-02-02 | Disposition: A | Discharge status: Home / Self Care | Payer: 59 | Source: Ambulatory Visit | Attending: Adult Health | Admitting: Adult Health

## 2020-02-02 ENCOUNTER — Encounter (HOSPITAL_COMMUNITY): Payer: Self-pay

## 2020-02-02 ENCOUNTER — Other Ambulatory Visit: Payer: Self-pay

## 2020-02-02 DIAGNOSIS — N6321 Unspecified lump in the left breast, upper outer quadrant: Secondary | ICD-10-CM | POA: Diagnosis not present

## 2021-04-21 ENCOUNTER — Other Ambulatory Visit (HOSPITAL_COMMUNITY): Payer: Self-pay | Admitting: Adult Health

## 2021-04-28 ENCOUNTER — Ambulatory Visit: Payer: 59 | Admitting: Adult Health

## 2021-06-27 ENCOUNTER — Encounter (INDEPENDENT_AMBULATORY_CARE_PROVIDER_SITE_OTHER): Payer: Self-pay | Admitting: *Deleted

## 2021-07-11 ENCOUNTER — Encounter (INDEPENDENT_AMBULATORY_CARE_PROVIDER_SITE_OTHER): Payer: Self-pay | Admitting: Gastroenterology

## 2021-07-11 ENCOUNTER — Encounter (INDEPENDENT_AMBULATORY_CARE_PROVIDER_SITE_OTHER): Payer: Self-pay

## 2021-07-11 ENCOUNTER — Telehealth (INDEPENDENT_AMBULATORY_CARE_PROVIDER_SITE_OTHER): Payer: Self-pay

## 2021-07-11 ENCOUNTER — Other Ambulatory Visit (INDEPENDENT_AMBULATORY_CARE_PROVIDER_SITE_OTHER): Payer: Self-pay

## 2021-07-11 ENCOUNTER — Ambulatory Visit (INDEPENDENT_AMBULATORY_CARE_PROVIDER_SITE_OTHER): Payer: 59 | Admitting: Gastroenterology

## 2021-07-11 VITALS — BP 143/80 | HR 76 | Temp 98.1°F | Ht 64.0 in | Wt 174.7 lb

## 2021-07-11 DIAGNOSIS — K5732 Diverticulitis of large intestine without perforation or abscess without bleeding: Secondary | ICD-10-CM

## 2021-07-11 DIAGNOSIS — K625 Hemorrhage of anus and rectum: Secondary | ICD-10-CM

## 2021-07-11 MED ORDER — CLENPIQ 10-3.5-12 MG-GM -GM/160ML PO SOLN: 1.0000 | Freq: Once | ORAL | 0 refills | Status: DC

## 2021-07-11 NOTE — Telephone Encounter (Signed)
Holly Golden Holly Golden Holly Golden, CMA  ?

## 2021-07-11 NOTE — Patient Instructions (Signed)
We will get you scheduled for colonoscopy for further evaluation of suspected diverticulitis, this will need to be done after June 10th. Please let me know if abdominal pain becomes more severe, you have worsening rectal bleeding, recurrence of fevers or chills. ? ?As discussed, you do not need to avoid seeds/nuts as there is no evidence that these cause diverticulitis, eating a diet high in fiber after resolution of acute diverticulitis is helpful to prevent further episodes.  ? ?I have also provided the low FODMAP eating guide, this can be helpful with IBS.  ?

## 2021-07-11 NOTE — Progress Notes (Signed)
? ?Referring Provider: Rory Percy, MD ?Primary Care Physician:  Rory Percy, MD ?Primary GI Physician: New ? ?Chief Complaint  ?Patient presents with  ? Diverticulitis  ?  Patient here today for a follow up on Diverticulitis, had three weeks ago and was treated. Also had some rectal bleeding and has seen bright red blood on four occasions.  ? ?HPI:   ?Holly Golden is a 51 y.o. female with past medical history of arthritis, fatty liver, GERD, hemorrhoids, Raynaud's disease, high cholesterol.  ? ?Patient presenting today as a new patient for diverticulitis. ? ?She states that she had onset of LLQ pain that gradually increased, she developed chills and fever of 102, saw PCP and was Treated empirically for suspected diverticulitis by PCP with cipro and flagyl (April 15) with improvement of symptoms thereafter. No abdominal imaging done at that time. She has gone back to regular diet without issue, is trying to increase fiber intake as well. Denies any previous history of diverticulitis, no diverticula noted on last colonoscopy in 2019. Patient reports since right before easter until now, she has had some blood mixed in with her stool and on the toilet paper when wiping, endorses 4 occasions of this. She does have a history of hemorrhoids. She has a hx of IBS, reports that she has a BM everyday, though she reports that she has a mix of constipation and looser stools, will start with harder stools and then become more mushy/mud like in consistency. She reports she has some continued LLQ tenderness, notes this prior to having a BM, improves thereafter. Denies nausea, vomiting, changes in appetite or weight loss. No melena.  ? ?Prior to diverticulitis she was taking about '600mg'$  ibuprofen for headaches, had been doing this for probably for about a year. States she is trying to take only tylenol now.  ? ?NSAID use: none currently.  ?Social hx: no etoh or tobacco  ?Fam hx: father had CRC at 47, paternal grandmother  had CRC, cousin on maternal side died from [facility]. ? ?Last Colonoscopy:04/05/17- The entire examined colon is normal. ?- Internal hemorrhoids. ?- No specimens collected. ? ?Past Medical History:  ?Diagnosis Date  ? Arthritis   ? MILD OA HANDS  ? Bleeding gums 02/15/2015  ? Breast nodule 08/15/2012  ? Left breast nodule at 2 oclock will get mammogram and [place]  ? Elevated triglycerides with high cholesterol 07/14/2014  ? Fatty liver   ? Gall bladder disease   ? GERD (gastroesophageal reflux disease)   ? Hair loss   ? Headache   ? Hemorrhoids   ? History of kidney stones   ? Joint pain   ? LUQ pain 02/15/2015  ? Multiple hemangiomas   ? in liver and spleen  ? PONV (postoperative nausea and vomiting)   ? Raynaud's disease   ? Rectal bleeding   ? Seasonal allergies   ? Stress 07/14/2014  ? Swelling   ? feet or hands  ? Swelling of hand 11/17/2012  ? Has bilateral swelling in hands, and they are cold  ? UTI (urinary tract infection)   ? Varicose veins   ? ? ?Past Surgical History:  ?Procedure Laterality Date  ? CESAREAN SECTION    ? 1991  ? CHOLECYSTECTOMY    ? 2010 LAPROSCOPIC  ? COLONOSCOPY  08/06/2011  ? Procedure: COLONOSCOPY;  Surgeon: Rogene Houston, MD;  [facility];  Service: Endoscopy;  Laterality: N/A;  730  ? COLONOSCOPY N/A 04/05/2017  ? Procedure: COLONOSCOPY;  Surgeon: Rogene Houston, MD;  [facility];  Service: Endoscopy;  Laterality: N/A;  855-moved to 940 per Ann  ? ENDOVENOUS ABLATION SAPHENOUS VEIN W/ LASER Left 12-10-2013  ? EVLA  left small saphenous vein  and stab phlebectomy <10 Incision left leg  ? ENDOVENOUS ABLATION SAPHENOUS VEIN W/ LASER Right 12-31-2013  ? right small saphenous vein  by Curt Jews MD  ? LAPAROSCOPIC SPLENECTOMY N/A 04/17/2017  ? Procedure: LAPAROSCOPIC SPLENECTOMY;  Surgeon: Michael Boston, MD;  Location: [facility];  Service: General;  Laterality: N/A;  ? left tube and ovary removed for a benign mass.    ? splenic hemangiomas    ? ? ?Current Outpatient Medications   ?Medication Sig Dispense Refill  ? acetaminophen (TYLENOL) 500 MG tablet Take 500 mg by mouth every 6 (six) hours as needed.    ? cetirizine (ZYRTEC) 10 MG tablet Take 10 mg by mouth daily.    ? doxycycline (VIBRAMYCIN) 50 MG capsule Take 50 mg by mouth daily at 2 PM.     ? finasteride (PROSCAR) 5 MG tablet Take 2.5 mg by mouth every evening.   5  ? fluticasone (FLONASE) 50 MCG/ACT nasal spray Place 1 spray into both nostrils daily. As needed    ? ibuprofen (ADVIL,MOTRIN) 200 MG tablet Take 600 mg by mouth every 6 (six) hours as needed for headache or moderate pain.     ? metroNIDAZOLE (METROGEL) 0.75 % gel Apply 1 application topically every evening.     ? ?No current facility-administered medications for this visit.  ? ? ?Allergies as of 07/11/2021  ? (No Known Allergies)  ? ? ?Family History  ?Problem Relation Age of Onset  ? Seizures Mother   ? Hyperlipidemia Mother   ? Thyroid disease Mother   ? Anxiety disorder Mother   ? Sleep apnea Mother   ?     no longer on CPAP   ? Obesity Mother   ? Colon cancer Father   ? Seizures Brother   ? Depression Brother   ? Cancer Maternal Grandmother   ?     pancreatic  ? Cirrhosis Maternal Grandfather   ? Cancer Paternal Grandmother   ?     breast and colon  ? Stroke Paternal Grandfather   ? Healthy Son   ? Healthy Son   ? Cancer Cousin   ?     colon, hodgkin's  ? Diabetes Cousin   ? Diabetes Other   ? ? ?Social History  ? ?Socioeconomic History  ? Marital status: Married  ?  Spouse name: Audry Pili  ? Number of children: 2  ? Years of education: Not on file  ? Highest education level: Not on file  ?Occupational History  ? Occupation: LPN  ?  Employer: [employer]  ?Tobacco Use  ? Smoking status: Never  ? Smokeless tobacco: Never  ?Vaping Use  ? Vaping Use: Never used  ?Substance and Sexual Activity  ? Alcohol use: No  ? Drug use: No  ? Sexual activity: Not Currently  ?  Birth control/protection: Surgical  ?  Comment: husband had vasectomy  ?Other Topics Concern  ? Not on file   ?Social History Narrative  ? Not on file  ? ?Social Determinants of Health  ? ?Financial Resource Strain: Not on file  ?Food Insecurity: Not on file  ?Transportation Needs: Not on file  ?Physical Activity: Not on file  ?Stress: Not on file  ?Social Connections: Not on file  ? ?Review of  systems ?General: negative for malaise, night sweats, fever, chills, weight loss ?Neck: Negative for lumps, goiter, pain and significant neck swelling ?Resp: Negative for cough, wheezing, dyspnea at rest ?CV: Negative for chest pain, leg swelling, palpitations, orthopnea ?GI: denies melena, nausea, vomiting, dysphagia, odyonophagia, early satiety or unintentional weight loss. +hematochezia +LLQ pain +constipation +mushy stools ?MSK: Negative for joint pain or swelling, back pain, and muscle pain. ?Derm: Negative for itching or rash ?Psych: Denies depression, anxiety, memory loss, confusion. No homicidal or suicidal ideation.  ?Heme: Negative for prolonged bleeding, bruising easily, and swollen nodes. ?Endocrine: Negative for cold or heat intolerance, polyuria, polydipsia and goiter. ?Neuro: negative for tremor, gait imbalance, syncope and seizures. ?The remainder of the review of systems is noncontributory. ? ?Physical Exam: ?BP (!) 143/80 (BP Location: Left Arm, Patient Position: Sitting, Cuff Size: Large)   Pulse 76   Temp 98.1 ?F (36.7 ?C) (Oral)   Ht '5\' 4"'$  (1.626 m)   Wt 174 lb 11.2 oz (79.2 kg)   BMI 29.99 kg/m?  ?General:   Alert and oriented. No distress noted. Pleasant and cooperative.  ?Head:  Normocephalic and atraumatic. ?Eyes:  Conjuctiva clear without scleral icterus. ?Mouth:  Oral mucosa pink and moist. Good dentition. No lesions. ?Heart: Normal rate and rhythm, s1 and s2 heart sounds present.  ?Lungs: Clear lung sounds in all lobes. Respirations equal and unlabored. ?Abdomen:  +BS, soft, and non-distended. Mild TTP of diffuse lower abdomen. No rebound or guarding. No HSM or masses noted. ?Derm: No palmar erythema  or jaundice ?Msk:  Symmetrical without gross deformities. Normal posture. ?Extremities:  Without edema. ?Neurologic:  Alert and  oriented x4 ?Psych:  Alert and cooperative. Normal mood and affect. ? ?Invalid inp

## 2021-07-14 ENCOUNTER — Other Ambulatory Visit (INDEPENDENT_AMBULATORY_CARE_PROVIDER_SITE_OTHER): Payer: Self-pay | Admitting: *Deleted

## 2021-07-14 MED ORDER — CLENPIQ 10-3.5-12 MG-GM -GM/160ML PO SOLN: 1.0000 | Freq: Once | ORAL | 0 refills | Status: AC

## 2021-07-17 ENCOUNTER — Telehealth (INDEPENDENT_AMBULATORY_CARE_PROVIDER_SITE_OTHER): Payer: Self-pay

## 2021-07-17 MED ORDER — NA SULFATE-K SULFATE-MG SULF 17.5-3.13-1.6 GM/177ML PO SOLN: 1.0000 | Freq: Once | ORAL | 0 refills | Status: AC

## 2021-07-17 NOTE — Telephone Encounter (Signed)
Lillias Difrancesco Ann Cloie Wooden, CMA  ?

## 2021-07-17 NOTE — Telephone Encounter (Signed)
Yesha Muchow Ann Demaya Hardge, CMA  ?

## 2021-07-18 ENCOUNTER — Other Ambulatory Visit (HOSPITAL_COMMUNITY): Payer: Self-pay | Admitting: Adult Health

## 2021-07-18 ENCOUNTER — Encounter (INDEPENDENT_AMBULATORY_CARE_PROVIDER_SITE_OTHER): Payer: Self-pay

## 2021-07-18 DIAGNOSIS — Z1231 Encounter for screening mammogram for malignant neoplasm of breast: Secondary | ICD-10-CM

## 2021-08-02 ENCOUNTER — Ambulatory Visit (HOSPITAL_COMMUNITY)
Admission: RE | Admit: 2021-08-02 | Discharge: 2021-08-02 | Disposition: A | Discharge status: Home / Self Care | Payer: 59 | Source: Ambulatory Visit | Attending: Adult Health | Admitting: Adult Health

## 2021-08-02 DIAGNOSIS — Z1231 Encounter for screening mammogram for malignant neoplasm of breast: Secondary | ICD-10-CM | POA: Diagnosis present

## 2021-08-07 ENCOUNTER — Ambulatory Visit (INDEPENDENT_AMBULATORY_CARE_PROVIDER_SITE_OTHER): Payer: 59 | Admitting: Gastroenterology

## 2021-08-17 ENCOUNTER — Encounter (HOSPITAL_COMMUNITY)
Admission: RE | Disposition: A | Discharge status: Home / Self Care | Payer: Self-pay | Source: Ambulatory Visit | Attending: Internal Medicine

## 2021-08-17 ENCOUNTER — Ambulatory Visit (HOSPITAL_COMMUNITY)
Admission: RE | Admit: 2021-08-17 | Discharge: 2021-08-17 | Disposition: A | Discharge status: Home / Self Care | Payer: 59 | Source: Ambulatory Visit | Attending: Internal Medicine | Admitting: Internal Medicine

## 2021-08-17 ENCOUNTER — Ambulatory Visit (HOSPITAL_COMMUNITY): Payer: 59 | Admitting: Certified Registered Nurse Anesthetist

## 2021-08-17 ENCOUNTER — Encounter (HOSPITAL_COMMUNITY): Payer: Self-pay | Admitting: Internal Medicine

## 2021-08-17 ENCOUNTER — Ambulatory Visit (HOSPITAL_BASED_OUTPATIENT_CLINIC_OR_DEPARTMENT_OTHER): Payer: 59 | Admitting: Certified Registered Nurse Anesthetist

## 2021-08-17 ENCOUNTER — Other Ambulatory Visit: Payer: Self-pay

## 2021-08-17 DIAGNOSIS — K644 Residual hemorrhoidal skin tags: Secondary | ICD-10-CM | POA: Diagnosis not present

## 2021-08-17 DIAGNOSIS — K5732 Diverticulitis of large intestine without perforation or abscess without bleeding: Secondary | ICD-10-CM | POA: Diagnosis not present

## 2021-08-17 DIAGNOSIS — K219 Gastro-esophageal reflux disease without esophagitis: Secondary | ICD-10-CM | POA: Insufficient documentation

## 2021-08-17 DIAGNOSIS — Q438 Other specified congenital malformations of intestine: Secondary | ICD-10-CM | POA: Diagnosis not present

## 2021-08-17 DIAGNOSIS — K648 Other hemorrhoids: Secondary | ICD-10-CM | POA: Insufficient documentation

## 2021-08-17 DIAGNOSIS — K635 Polyp of colon: Secondary | ICD-10-CM | POA: Diagnosis not present

## 2021-08-17 DIAGNOSIS — K625 Hemorrhage of anus and rectum: Secondary | ICD-10-CM

## 2021-08-17 DIAGNOSIS — K5733 Diverticulitis of large intestine without perforation or abscess with bleeding: Secondary | ICD-10-CM | POA: Diagnosis not present

## 2021-08-17 DIAGNOSIS — K5731 Diverticulosis of large intestine without perforation or abscess with bleeding: Secondary | ICD-10-CM | POA: Insufficient documentation

## 2021-08-17 DIAGNOSIS — Z8719 Personal history of other diseases of the digestive system: Secondary | ICD-10-CM | POA: Insufficient documentation

## 2021-08-17 DIAGNOSIS — D123 Benign neoplasm of transverse colon: Secondary | ICD-10-CM | POA: Insufficient documentation

## 2021-08-17 DIAGNOSIS — K5730 Diverticulosis of large intestine without perforation or abscess without bleeding: Secondary | ICD-10-CM

## 2021-08-17 DIAGNOSIS — Z800 Family history of malignant neoplasm of digestive organs: Secondary | ICD-10-CM | POA: Diagnosis not present

## 2021-08-17 LAB — HM COLONOSCOPY

## 2021-08-17 SURGERY — COLONOSCOPY WITH PROPOFOL
Anesthesia: General

## 2021-08-17 MED ORDER — METAMUCIL SMOOTH TEXTURE 58.6 % PO POWD: 1.0000 | Freq: Every day | ORAL | Status: DC

## 2021-08-17 MED ORDER — PROPOFOL 10 MG/ML IV BOLUS
INTRAVENOUS | Status: DC | PRN
  Administered 2021-08-17: 100 mg via INTRAVENOUS

## 2021-08-17 MED ORDER — LIDOCAINE HCL (CARDIAC) PF 100 MG/5ML IV SOSY
PREFILLED_SYRINGE | INTRAVENOUS | Status: DC | PRN
  Administered 2021-08-17: 50 mg via INTRAVENOUS

## 2021-08-17 MED ORDER — PROPOFOL 500 MG/50ML IV EMUL
INTRAVENOUS | Status: DC | PRN
  Administered 2021-08-17: 150 ug/kg/min via INTRAVENOUS

## 2021-08-17 MED ORDER — LACTATED RINGERS IV SOLN: INTRAVENOUS | Status: DC

## 2021-08-17 NOTE — H&P (Signed)
Holly Golden is an 51 y.o. female.   Chief Complaint: Patient is here for colonoscopy. HPI: Patient is 51 year old Caucasian female who is here for diagnostic colonoscopy.  Back in April of this year she was treated for diverticulitis by primary care physician.  She says she had been taking ibuprofen 600 to 1200 mg daily for headache.  She responded to antibiotic therapy.  Since then she has noted rectal bleeding with her bowel movements which is felt to be due to hemorrhoids.  She underwent high rescreening colonoscopy in February 2019 that revealed internal hemorrhoids. Her bowels are regular.  She denies abdominal pain. She does not take aspirin or anticoagulants. Family history significant for colon carcinoma in paternal grandmother who was in her 24s at the time of diagnosis.  Her father was also diagnosed with colon carcinoma in his 20s and he is doing fine.  First cousin on mother side was diagnosed with colon carcinoma in her 22s and died at age 55.  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   PONV (postoperative nausea and vomiting)    Raynaud's disease    Rectal bleeding    Seasonal allergies    Stress 07/14/2014   Swelling    feet or hands   Swelling of hand 11/17/2012   Has bilateral swelling in hands, and they are cold   UTI (urinary tract infection)    Varicose veins     Past Surgical History:  Procedure Laterality Date   CESAREAN SECTION     1991   CHOLECYSTECTOMY     2010 LAPROSCOPIC   COLONOSCOPY  08/06/2011   Procedure: COLONOSCOPY;   Surgeon: Rogene Houston, MD;  Location: [facility];  Service: Endoscopy;  Laterality: N/A;  730   COLONOSCOPY N/A 04/05/2017   Procedure: COLONOSCOPY;  Surgeon: Rogene Houston, MD;  Location: [facility];  Service: Endoscopy;  Laterality: N/A;  855-moved to 940 per Ann   ENDOVENOUS ABLATION SAPHENOUS VEIN W/ LASER Left 12-10-2013   EVLA  left small saphenous vein  and stab phlebectomy <10 Incision left leg   ENDOVENOUS ABLATION SAPHENOUS VEIN W/ LASER Right 12-31-2013   right small saphenous vein  by Curt Jews MD   LAPAROSCOPIC SPLENECTOMY N/A 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     splenic hemangiomas      Family History  Problem Relation Age of Onset   Seizures Mother    Hyperlipidemia Mother    Thyroid disease Mother    Anxiety disorder Mother    Sleep apnea Mother        no longer on CPAP    Obesity Mother    Colon cancer Father    Seizures Brother    Depression Brother    Cancer Maternal Grandmother  pancreatic   Cirrhosis Maternal Grandfather    Cancer Paternal Grandmother        breast and colon   Stroke Paternal Grandfather    Healthy Son    Healthy Son    Cancer Cousin        colon, hodgkin's   Diabetes Cousin    Diabetes Other    Social History:  reports that she has never smoked. She has never used smokeless tobacco. She reports that she does not drink alcohol and does not use drugs.  Allergies: No Known Allergies  Medications Prior to Admission  Medication Sig Dispense Refill   acetaminophen (TYLENOL) 500 MG tablet Take 1,000 mg by mouth every 6 (six) hours as needed for headache.     cetirizine (ZYRTEC) 10 MG tablet Take 10 mg by mouth daily.     doxycycline (VIBRAMYCIN) 50 MG capsule Take 50 mg by mouth daily at 2 PM.      finasteride (PROSCAR) 5 MG tablet Take 2.5 mg by mouth every evening.   5    fluticasone (FLONASE) 50 MCG/ACT nasal spray Place 1 spray into both nostrils daily as needed for allergies.     ibuprofen (ADVIL,MOTRIN) 200 MG tablet Take 600 mg by mouth every 6 (six) hours as needed for headache or moderate pain.      Liniments (DEEP BLUE RELIEF EX) Apply 1 Application topically daily as needed (Muscle pain).     metroNIDAZOLE (METROGEL) 0.75 % gel Apply 1 application. topically daily as needed (Rosacea).     Polyvinyl Alcohol-Povidone PF (REFRESH) 1.4-0.6 % SOLN Place 1 drop into both eyes daily as needed (Dry eye).      No results found for this or any previous visit (from the past 48 hour(s)). No results found.  Review of Systems  Blood pressure 131/85, pulse 76, temperature 98.6 F (37 C), temperature source Oral, resp. rate 15, height '5\' 4"'$  (1.626 m), weight 76.2 kg, SpO2 98 %. Physical Exam HENT:     Mouth/Throat:     Mouth: Mucous membranes are moist.     Pharynx: Oropharynx is clear.  Eyes:     General: No scleral icterus.    Conjunctiva/sclera: Conjunctivae normal.  Cardiovascular:     Rate and Rhythm: Normal rate and regular rhythm.     Heart sounds: Normal heart sounds. No murmur heard. Pulmonary:     Effort: Pulmonary effort is normal.     Breath sounds: Normal breath sounds.  Abdominal:     General: There is no distension.     Palpations: Abdomen is soft. There is no mass.     Tenderness: There is no abdominal tenderness.  Musculoskeletal:        General: No swelling.     Cervical back: Neck supple.  Lymphadenopathy:     Cervical: No cervical adenopathy.  Skin:    General: Skin is warm and dry.  Neurological:     Mental Status: She is alert.      Assessment/Plan  History of diverticulitis and rectal bleeding. Diagnostic colonoscopy.  Hildred Laser, MD 08/17/2021, 8:47 AM

## 2021-08-17 NOTE — Op Note (Signed)
[facility] Patient Name: Holly Golden Procedure Date: 08/17/2021 8:36 AM MRN: [id number] Date of Birth: 1970-10-09 Attending MD: Hildred Laser , MD CSN: [id number] Age: 51 Admit Type: Outpatient Procedure:                Colonoscopy Indications:              Rectal bleeding, Follow-up of diverticulitis Providers:                Hildred Laser, MD, Lambert Mody, Caprice Kluver Referring MD:             Consuello Masse, MD Medicines:                Propofol per Anesthesia Complications:            No immediate complications. Estimated Blood Loss:     Estimated blood loss was minimal. Procedure:                Pre-Anesthesia Assessment:                           - Prior to the procedure, a History and Physical                            was performed, and patient medications and                            allergies were reviewed. The patient's tolerance of                            previous anesthesia was also reviewed. The risks                            and benefits of the procedure and the sedation                            options and risks were discussed with the patient.                            All questions were answered, and informed consent                            was obtained. Prior Anticoagulants: The patient has                            taken no previous anticoagulant or antiplatelet                            agents except for NSAID medication. ASA Grade                            Assessment: II - A patient with mild systemic                            disease. After reviewing the risks and benefits,  the patient was deemed in satisfactory condition to                            undergo the procedure.                           After obtaining informed consent, the colonoscope                            was passed under direct vision. Throughout the                            procedure, the patient's blood pressure, pulse, and                             oxygen saturations were monitored continuously. The                            [id number]-[id number] ([id number]) scope was introduced through                            the anus and advanced to the the cecum, identified                            by appendiceal orifice and ileocecal valve. The                            colonoscopy was technically difficult and complex                            due to a redundant colon and significant looping.                            Successful completion of the procedure was aided by                            changing the patient to a supine position, using                            manual pressure and withdrawing the scope and                            replacing with the 'babyscope'. The patient                            tolerated the procedure well. The quality of the                            bowel preparation was good. The ileocecal valve,                            appendiceal orifice, and rectum were photographed. Scope In: 8:53:47 AM Scope Out: 9:37:17 AM Scope Withdrawal Time: 0 hours 7 minutes 19 seconds  Total Procedure Duration: 0 hours 43 minutes 30 seconds  Findings:      Skin tags were found on perianal exam.      A 5 mm polyp was found in the hepatic flexure. The polyp was       semi-sessile. The polyp was removed with a cold snare. Resection and       retrieval were complete.      A few diverticula were found in the sigmoid colon and descending colon.      Internal hemorrhoids were found during retroflexion. The hemorrhoids       were small. Impression:               - Perianal skin tags found on perianal exam.                           - One 5 mm polyp at the hepatic flexure, removed                            with a cold snare. Resected and retrieved.                           - Diverticulosis in the sigmoid colon and in the                            descending colon.                           - Internal  hemorrhoids. Moderate Sedation:      Per Anesthesia Care Recommendation:           - Patient has a contact number available for                            emergencies. The signs and symptoms of potential                            delayed complications were discussed with the                            patient. Return to normal activities tomorrow.                            Written discharge instructions were provided to the                            patient.                           - High fiber diet today.                           - Continue present medications.                           - No aspirin, ibuprofen, naproxen, or other  non-steroidal anti-inflammatory drugs for 1 day.                           - Await pathology results.                           - Repeat colonoscopy in 5 years. Procedure Code(s):        --- Professional ---                           [phone], Colonoscopy, flexible; with removal of                            tumor(s), polyp(s), or other lesion(s) by snare                            technique Diagnosis Code(s):        --- Professional ---                           K63.5, Polyp of colon                           K64.8, Other hemorrhoids                           K64.4, Residual hemorrhoidal skin tags                           K62.5, Hemorrhage of anus and rectum                           K57.32, Diverticulitis of large intestine without                            perforation or abscess without bleeding                           K57.30, Diverticulosis of large intestine without                            perforation or abscess without bleeding CPT copyright 2019 American Medical Association. All rights reserved. The codes documented in this report are preliminary and upon coder review may  be revised to meet current compliance requirements. Hildred Laser, MD Hildred Laser, MD 08/17/2021 9:49:26 AM This report has been signed  electronically. Number of Addenda: 0

## 2021-08-17 NOTE — Transfer of Care (Signed)
Immediate Anesthesia Transfer of Care Note  Patient: Holly Golden  Procedure(s) Performed: COLONOSCOPY WITH PROPOFOL POLYPECTOMY  Patient Location: Endoscopy Unit  Anesthesia Type:General  Level of Consciousness: drowsy  Airway & Oxygen Therapy: Patient Spontanous Breathing  Post-op Assessment: Report given to RN and Post -op Vital signs reviewed and stable  Post vital signs: Reviewed and stable  Last Vitals:  Vitals Value Taken Time  BP    Temp 97.8   Pulse 69   Resp 18   SpO2 94%     Last Pain:  Vitals:   08/17/21 0850  TempSrc:   PainSc: 5       Patients Stated Pain Goal: 4 (52/08/02 2336)  Complications: No notable events documented.

## 2021-08-17 NOTE — Anesthesia Preprocedure Evaluation (Signed)
Anesthesia Evaluation  Patient identified by MRN, date of birth, ID band Patient awake    Reviewed: Allergy & Precautions, H&P , NPO status , Patient's Chart, lab work & pertinent test results, reviewed documented beta blocker date and time   History of Anesthesia Complications (+) PONV and history of anesthetic complications  Airway Mallampati: II  TM Distance: >3 FB Neck ROM: full    Dental no notable dental hx.    Pulmonary neg pulmonary ROS,    Pulmonary exam normal breath sounds clear to auscultation       Cardiovascular Exercise Tolerance: Good negative cardio ROS   Rhythm:regular Rate:Normal     Neuro/Psych  Headaches, negative psych ROS   GI/Hepatic Neg liver ROS, GERD  Medicated,  Endo/Other  negative endocrine ROS  Renal/GU negative Renal ROS  negative genitourinary   Musculoskeletal   Abdominal   Peds  Hematology negative hematology ROS (+)   Anesthesia Other Findings   Reproductive/Obstetrics negative OB ROS                             Anesthesia Physical Anesthesia Plan  ASA: 2  Anesthesia Plan: General   Post-op Pain Management:    Induction:   PONV Risk Score and Plan: Propofol infusion  Airway Management Planned:   Additional Equipment:   Intra-op Plan:   Post-operative Plan:   Informed Consent: I have reviewed the patients History and Physical, chart, labs and discussed the procedure including the risks, benefits and alternatives for the proposed anesthesia with the patient or authorized representative who has indicated his/her understanding and acceptance.     Dental Advisory Given  Plan Discussed with: CRNA  Anesthesia Plan Comments:         Anesthesia Quick Evaluation

## 2021-08-17 NOTE — Discharge Instructions (Signed)
No aspirin or NSAIDs for 24 hours Keep ibuprofen use to minimum and consider taking 400 mg at a time if it works Resume other medications as before Metamucil 1 heaping tablespoonful by mouth daily at bedtime High-fiber diet No driving for 24 hours Physician will call with biopsy results.

## 2021-08-18 ENCOUNTER — Telehealth (INDEPENDENT_AMBULATORY_CARE_PROVIDER_SITE_OTHER): Payer: Self-pay | Admitting: *Deleted

## 2021-08-18 LAB — SURGICAL PATHOLOGY

## 2021-08-18 NOTE — Anesthesia Postprocedure Evaluation (Signed)
Anesthesia Post Note  Patient: Holly Golden  Procedure(s) Performed: COLONOSCOPY WITH PROPOFOL POLYPECTOMY  Patient location during evaluation: Phase II Anesthesia Type: General Level of consciousness: awake Pain management: pain level controlled Vital Signs Assessment: post-procedure vital signs reviewed and stable Respiratory status: spontaneous breathing and respiratory function stable Cardiovascular status: blood pressure returned to baseline and stable Postop Assessment: no headache and no apparent nausea or vomiting Anesthetic complications: no Comments: Late entry   No notable events documented.   Last Vitals:  Vitals:   08/17/21 0939 08/17/21 0942  BP: (!) 88/51 101/63  Pulse: 67 88  Resp: 18 20  Temp: 36.6 C   SpO2: 94% 95%    Last Pain:  Vitals:   08/17/21 0942  TempSrc:   PainSc: 0-No pain                 Louann Sjogren

## 2021-08-18 NOTE — Telephone Encounter (Signed)
Patient had colonoscopy yesterday performed by Dr. Laural Golden. She states she woke up this morning with a nagging pain in RLQ. Its constant and like an ache and worse with movement.   [phone]

## 2021-08-18 NOTE — Telephone Encounter (Signed)
Spoke with the patient, she reports having a nagging pain in the right upper quadrant in the morning but it eventually subsided on its own.  She will let us know if the pain recurs.  I advised her if severe, she will need to come to the ER for further evaluation.

## 2021-08-22 ENCOUNTER — Encounter (INDEPENDENT_AMBULATORY_CARE_PROVIDER_SITE_OTHER): Payer: Self-pay | Admitting: *Deleted

## 2021-08-24 ENCOUNTER — Encounter (HOSPITAL_COMMUNITY): Payer: Self-pay | Admitting: Internal Medicine

## 2021-09-29 ENCOUNTER — Other Ambulatory Visit (HOSPITAL_COMMUNITY)
Admission: RE | Admit: 2021-09-29 | Discharge: 2021-09-29 | Disposition: A | Discharge status: Home / Self Care | Payer: 59 | Source: Ambulatory Visit | Attending: Adult Health | Admitting: Adult Health

## 2021-09-29 ENCOUNTER — Encounter: Payer: Self-pay | Admitting: Adult Health

## 2021-09-29 ENCOUNTER — Ambulatory Visit (INDEPENDENT_AMBULATORY_CARE_PROVIDER_SITE_OTHER): Payer: 59 | Admitting: Adult Health

## 2021-09-29 ENCOUNTER — Other Ambulatory Visit: Payer: 59

## 2021-09-29 VITALS — BP 130/91 | HR 69 | Ht 63.0 in | Wt 175.0 lb

## 2021-09-29 DIAGNOSIS — R232 Flushing: Secondary | ICD-10-CM

## 2021-09-29 DIAGNOSIS — Z01419 Encounter for gynecological examination (general) (routine) without abnormal findings: Secondary | ICD-10-CM | POA: Insufficient documentation

## 2021-09-29 DIAGNOSIS — E782 Mixed hyperlipidemia: Secondary | ICD-10-CM

## 2021-09-29 DIAGNOSIS — Z136 Encounter for screening for cardiovascular disorders: Secondary | ICD-10-CM | POA: Insufficient documentation

## 2021-09-29 DIAGNOSIS — N951 Menopausal and female climacteric states: Secondary | ICD-10-CM | POA: Diagnosis not present

## 2021-09-29 NOTE — Progress Notes (Signed)
Patient ID: Holly Golden, female   DOB: 1970-04-17, 51 y.o.   MRN: [id number] History of Present Illness: Holly Golden is a 51 year old white female,married, G2P2 in for a well woman gyn exam and pap. She had fasting labs this morning. Having irregular periods and some hot flashes and feels not as focused at times.  She will have cough after eating, no heartburn  Sasser, Silvestre Moment, MD, PCP   Current Medications, Allergies, Past Medical History, Past Surgical History, Family History and Social History were reviewed in [employer] record.     Review of Systems: Patient denies any headaches, hearing loss, fatigue, blurred vision, shortness of breath, chest pain, abdominal pain, problems with bowel movements, urination, or intercourse(not active). No joint pain or mood swings.  See HPI for positives   Physical Exam:BP (!) 130/91 (BP Location: Left Arm, Patient Position: Sitting, Cuff Size: Normal)   Pulse 69   Ht '5\' 3"'$  (1.6 m)   Wt 175 lb (79.4 kg)   LMP 06/29/2021   BMI 31.00 kg/m   General:  Well developed, well nourished, no acute distress Skin:  Warm and dry Neck:  Midline trachea, normal thyroid, good ROM, no lymphadenopathy Lungs; Clear to auscultation bilaterally Breast:  No dominant palpable mass, retraction, or nipple discharge Cardiovascular: Regular rate and rhythm Abdomen:  Soft, non tender, no hepatosplenomegaly Pelvic:  External genitalia is normal in appearance, no lesions.  The vagina is normal in appearance. Urethra has no lesions or masses. The cervix is bulbous.  Uterus is felt to be normal size, shape, and contour.  No adnexal masses or tenderness noted.Bladder is non tender, no masses felt. Rectal: Deferred, had colonoscopy in April Extremities/musculoskeletal:  No swelling or varicosities noted, no clubbing or cyanosis Psych:  No mood changes, alert and cooperative,seems happy AA is 0 Fall risk is low    09/29/2021    9:33 AM 10/09/2018    8:45 AM  03/22/2017    9:13 AM  Depression screen PHQ 2/9  Decreased Interest 0 0 0  Down, Depressed, Hopeless 0 1 0  PHQ - 2 Score 0 1 0  Altered sleeping 0    Tired, decreased energy 0    Change in appetite 0    Feeling bad or failure about yourself  0    Trouble concentrating 0    Moving slowly or fidgety/restless 0    Suicidal thoughts 0    PHQ-9 Score 0         09/29/2021    9:36 AM  GAD 7 : Generalized Anxiety Score  Nervous, Anxious, on Edge 2  Control/stop worrying 0  Worry too much - different things 0  Trouble relaxing 2  Restless 0  Easily annoyed or irritable 2  Afraid - awful might happen 0  Total GAD 7 Score 6      Upstream - 09/29/21 0931       Pregnancy Intention Screening   Does the patient want to become pregnant in the next year? No    Does the patient's partner want to become pregnant in the next year? No    Would the patient like to discuss contraceptive options today? No      Contraception Wrap Up   Current Method Vasectomy    End Method Vasectomy    Contraception Counseling Provided No             Examination chaperoned by Levy Pupa LPN  Impression and Plan:  1. Encounter for  gynecological examination with Papanicolaou smear of cervix Pap sent Physical in 1 year Pap in 3 years if normal Had labs this am Had mammogram 08/02/21, and negative  Colonoscopy was in April 2023, repeat in 5 years Try OTC Prilosec every day for 2 weeks and let me know if still has cough after eating   2. Hot flashes Discussed options, will follow for now  3. Peri-menopause Review handout on menopause

## 2021-09-30 LAB — CBC
Hematocrit: 43.8 % (ref 34.0–46.6)
Hemoglobin: 14.6 g/dL (ref 11.1–15.9)
MCH: 29.8 pg (ref 26.6–33.0)
MCHC: 33.3 g/dL (ref 31.5–35.7)
MCV: 89 fL (ref 79–97)
Platelets: 499 10*3/uL — ABNORMAL HIGH (ref 150–450)
RBC: 4.9 x10E6/uL (ref 3.77–5.28)
RDW: 13.7 % (ref 11.7–15.4)
WBC: 5.3 10*3/uL (ref 3.4–10.8)

## 2021-09-30 LAB — LIPID PANEL
Chol/HDL Ratio: 5.7 ratio — ABNORMAL HIGH (ref 0.0–4.4)
Cholesterol, Total: 193 mg/dL (ref 100–199)
HDL: 34 mg/dL — ABNORMAL LOW (ref >39)
LDL Chol Calc (NIH): 122 mg/dL — ABNORMAL HIGH (ref 0–99)
Triglycerides: 206 mg/dL — ABNORMAL HIGH (ref 0–149)
VLDL Cholesterol Cal: 37 mg/dL (ref 5–40)

## 2021-09-30 LAB — COMPREHENSIVE METABOLIC PANEL
ALT: 12 IU/L (ref 0–32)
AST: 16 IU/L (ref 0–40)
Albumin/Globulin Ratio: 1.6 (ref 1.2–2.2)
Albumin: 4.6 g/dL (ref 3.8–4.9)
Alkaline Phosphatase: 98 IU/L (ref 44–121)
BUN/Creatinine Ratio: 17 (ref 9–23)
BUN: 14 mg/dL (ref 6–24)
Bilirubin Total: 0.5 mg/dL (ref 0.0–1.2)
CO2: 22 mmol/L (ref 20–29)
Calcium: 9.7 mg/dL (ref 8.7–10.2)
Chloride: 105 mmol/L (ref 96–106)
Creatinine, Ser: 0.81 mg/dL (ref 0.57–1.00)
Globulin, Total: 2.8 g/dL (ref 1.5–4.5)
Glucose: 86 mg/dL (ref 70–99)
Potassium: 4.6 mmol/L (ref 3.5–5.2)
Sodium: 141 mmol/L (ref 134–144)
Total Protein: 7.4 g/dL (ref 6.0–8.5)
eGFR: 88 mL/min/{1.73_m2} (ref >59)

## 2021-09-30 LAB — TSH: TSH: 3.38 u[IU]/mL (ref 0.450–4.500)

## 2021-10-03 LAB — CYTOLOGY - PAP
Comment: NEGATIVE
Diagnosis: NEGATIVE
High risk HPV: NEGATIVE

## 2021-10-16 ENCOUNTER — Encounter: Payer: Self-pay | Admitting: Orthopedic Surgery

## 2021-10-16 ENCOUNTER — Ambulatory Visit (INDEPENDENT_AMBULATORY_CARE_PROVIDER_SITE_OTHER): Payer: 59

## 2021-10-16 ENCOUNTER — Ambulatory Visit (INDEPENDENT_AMBULATORY_CARE_PROVIDER_SITE_OTHER): Payer: 59 | Admitting: Orthopedic Surgery

## 2021-10-16 VITALS — BP 147/90 | HR 80 | Ht 63.0 in | Wt 175.0 lb

## 2021-10-16 DIAGNOSIS — M79642 Pain in left hand: Secondary | ICD-10-CM

## 2021-10-16 DIAGNOSIS — G5622 Lesion of ulnar nerve, left upper limb: Secondary | ICD-10-CM | POA: Diagnosis not present

## 2021-10-16 NOTE — Progress Notes (Signed)
Chief Complaint  Patient presents with   Hand Pain    Left / pain swelling since Friday has tingling also     HPI: acute onset pain severe left wrist no taruma   X w Raynauds phoen.   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   PONV (postoperative nausea and vomiting)    Raynaud's disease    Rectal bleeding    Seasonal allergies    Stress 07/14/2014   Swelling    feet or hands   Swelling of hand 11/17/2012   Has bilateral swelling in hands, and they are cold   UTI (urinary tract infection)    Varicose veins     BP (!) 147/90   Pulse 80   Ht '5\' 3"'$  (1.6 m)   Wt 175 lb (79.4 kg)   LMP 06/29/2021   BMI 31.00 kg/m    General appearance: Well-developed well-nourished no gross deformities  Cardiovascular normal pulse and perfusion normal color without edema  Neurologically no sensation loss or deficits or pathologic reflexes  Psychological: Awake alert and oriented x3 mood and affect normal  Skin no lacerations or ulcerations no nodularity no palpable masses, no erythema or nodularity  Musculoskeletal: Normal appearance of the left upper extremity color capillary refill sensation normal tenderness over the hypothenar eminence  Imaging no encephali of the hamate fracture, there is a chronic scapholunate widening no evidence of arthritis around the wrist joint  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

## 2022-02-05 ENCOUNTER — Other Ambulatory Visit: Payer: Self-pay | Admitting: Adult Health

## 2022-02-05 MED ORDER — HYDROCORT-PRAMOXINE (PERIANAL) 2.5-1 % EX CREA: 1.0000 | TOPICAL_CREAM | Freq: Three times a day (TID) | CUTANEOUS | 3 refills | Status: DC

## 2022-02-05 NOTE — Progress Notes (Signed)
Will rx analpram HC

## 2022-05-03 ENCOUNTER — Encounter: Payer: Self-pay | Admitting: Radiology

## 2022-08-20 ENCOUNTER — Encounter: Payer: Self-pay | Admitting: Internal Medicine

## 2022-08-22 ENCOUNTER — Ambulatory Visit: Payer: 59 | Attending: Internal Medicine | Admitting: Internal Medicine

## 2022-08-22 ENCOUNTER — Encounter: Payer: Self-pay | Admitting: Internal Medicine

## 2022-08-22 VITALS — BP 132/96 | HR 83 | Ht 64.0 in | Wt 182.7 lb

## 2022-08-22 DIAGNOSIS — G4733 Obstructive sleep apnea (adult) (pediatric): Secondary | ICD-10-CM | POA: Diagnosis not present

## 2022-08-22 DIAGNOSIS — I493 Ventricular premature depolarization: Secondary | ICD-10-CM | POA: Diagnosis not present

## 2022-08-22 MED ORDER — METOPROLOL TARTRATE 25 MG PO TABS: 25.0000 mg | ORAL_TABLET | Freq: Two times a day (BID) | ORAL | 2 refills | Status: DC

## 2022-08-22 NOTE — Patient Instructions (Signed)
Medication Instructions:  Your physician has recommended you make the following change in your medication:  Start Metoprolol 25 mg twice a day  Labwork: None  Testing/Procedures: Your physician has recommended that you have a sleep study. This test records several body functions during sleep, including: brain activity, eye movement, oxygen and carbon dioxide blood levels, heart rate and rhythm, breathing rate and rhythm, the flow of air through your mouth and nose, snoring, body muscle movements, and chest and belly movement.   Follow-Up: Your physician recommends that you schedule a follow-up appointment in: 6 months  Any Other Special Instructions Will Be Listed Below (If Applicable).  If you need a refill on your cardiac medications before your next appointment, please call your pharmacy.

## 2022-08-22 NOTE — Progress Notes (Signed)
Cardiology Office Note  Date: 08/22/2022   ID: Holly Golden, DOB Oct 18, 1970, MRN [id number]  PCP:  Estanislado Pandy, MD  Cardiologist:  Marjo Bicker, MD Electrophysiologist:  None   Reason for Office Visit: Evaluation of PVCs   History of Present Illness: Holly Golden is a 52 y.o. female with no PMH was referred to cardiology clinic for evaluation of PVCs.  Patient was having palpitations since 1 year but recently started worsening to the point it happens every day.  Associated with dizziness and SOB.  Drinks coffee 2 cups a day, each around 10 ounces.  Denies any angina, syncope or leg swelling. She underwent event monitor with her PCP and was notified that she had V. tach.  She turned in the monitor recently.  I do not have the report to review.  She is also scheduled for echocardiogram symptoms week.   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   PONV (postoperative nausea and vomiting)    Raynaud's disease    Rectal bleeding    Seasonal allergies    Stress 07/14/2014   Swelling    feet or hands   Swelling of hand 11/17/2012   Has bilateral swelling in hands, and they are cold   UTI (urinary tract infection)    Varicose veins     Past Surgical History:  Procedure Laterality Date   CESAREAN SECTION     1991   CHOLECYSTECTOMY     2010 LAPROSCOPIC   COLONOSCOPY  08/06/2011   Procedure: COLONOSCOPY;  Surgeon: Malissa Hippo, MD;  [facility];  Service: Endoscopy;  Laterality:  N/A;  730   COLONOSCOPY N/A 04/05/2017   Procedure: COLONOSCOPY;  Surgeon: Malissa Hippo, MD;  [facility];  Service: Endoscopy;  Laterality: N/A;  855-moved to 940 per Ann   COLONOSCOPY WITH PROPOFOL N/A 08/17/2021   Procedure: COLONOSCOPY WITH PROPOFOL;  Surgeon: Malissa Hippo, MD;  [facility];  Service: Endoscopy;  Laterality: N/A;  935   ENDOVENOUS ABLATION SAPHENOUS VEIN W/ LASER Left 12-10-2013   EVLA  left small saphenous vein  and stab phlebectomy <10 Incision left leg   ENDOVENOUS ABLATION SAPHENOUS VEIN W/ LASER Right 12-31-2013   right small saphenous vein  by Gretta Began MD   LAPAROSCOPIC SPLENECTOMY N/A 04/17/2017   Procedure: LAPAROSCOPIC SPLENECTOMY;  Surgeon: Karie Soda, MD;  Location: [facility];  Service: General;  Laterality: N/A;   left tube and ovary removed for a benign mass.     POLYPECTOMY  08/17/2021   Procedure: POLYPECTOMY;  Surgeon: Malissa Hippo, MD;  [facility];  Service: Endoscopy;;   splenic hemangiomas      Current Outpatient Medications  Medication Sig Dispense Refill   acetaminophen (TYLENOL) 500 MG tablet Take 1,000 mg by mouth every 6 (six) hours as needed for  headache.     fluticasone (FLONASE) 50 MCG/ACT nasal spray Place 1 spray into both nostrils daily as needed for allergies.     hydrocortisone-pramoxine (ANALPRAM-HC) 2.5-1 % rectal cream Place 1 Application rectally 3 (three) times daily. 30 g 3   Liniments (DEEP BLUE RELIEF EX) Apply 1 Application topically daily as needed (Muscle pain).     metoprolol tartrate (LOPRESSOR) 25 MG tablet Take 1 tablet (25 mg total) by mouth 2 (two) times daily. 180 tablet 2   metroNIDAZOLE (METROGEL) 0.75 % gel Apply 1 application. topically daily as needed (Rosacea).     Polyvinyl Alcohol-Povidone PF (REFRESH) 1.4-0.6 % SOLN Place 1 drop into both eyes daily as needed (Dry eye).     No current facility-administered medications for this visit.   Allergies:  Patient has no  known allergies.   Social History: The patient  reports that she has never smoked. She has never used smokeless tobacco. She reports that she does not drink alcohol and does not use drugs.   Family History: The patient's family history includes Anxiety disorder in her mother; Cancer in her cousin, maternal grandmother, and paternal grandmother; Cirrhosis in her maternal grandfather; Colon cancer in her father; Depression in her brother; Diabetes in her cousin and another family member; Healthy in her son and son; Hyperlipidemia in her mother; Obesity in her mother; Seizures in her brother and mother; Sleep apnea in her mother; Stroke in her paternal grandfather; Thyroid disease in her mother.   ROS:  Please see the history of present illness. Otherwise, complete review of systems is positive for none  All other systems are reviewed and negative.   Physical Exam: VS:  BP (!) 132/96   Pulse 83   Ht 5\' 4"  (1.626 m)   Wt 182 lb 11.2 oz (82.9 kg)   SpO2 98%   BMI 31.36 kg/m , BMI Body mass index is 31.36 kg/m.  Wt Readings from Last 3 Encounters:  08/22/22 182 lb 11.2 oz (82.9 kg)  10/16/21 175 lb (79.4 kg)  09/29/21 175 lb (79.4 kg)    General: Patient appears comfortable at rest. HEENT: Conjunctiva and lids normal, oropharynx clear with moist mucosa. Neck: Supple, no elevated JVP or carotid bruits, no thyromegaly. Lungs: Clear to auscultation, nonlabored breathing at rest. Cardiac: Regular rate and rhythm, no S3 or significant systolic murmur, no pericardial rub. Abdomen: Soft, nontender, no hepatomegaly, bowel sounds present, no guarding or rebound. Extremities: No pitting edema, distal pulses 2+. Skin: Warm and dry. Musculoskeletal: No kyphosis. Neuropsychiatric: Alert and oriented x3, affect grossly appropriate.  Recent Labwork: 09/29/2021: ALT 12; AST 16; BUN 14; Creatinine, Ser 0.81; Hemoglobin 14.6; Platelets 499; Potassium 4.6; Sodium 141; TSH 3.380     Component Value  Date/Time   CHOL 193 09/29/2021 0912   TRIG 206 (H) 09/29/2021 0912   HDL 34 (L) 09/29/2021 0912   CHOLHDL 5.7 (H) 09/29/2021 0912   CHOLHDL 5.1 08/15/2012 0919   VLDL 50 (H) 08/15/2012 0919   LDLCALC 122 (H) 09/29/2021 0912      Assessment and Plan:  # Frequent PVCs, symptomatic -EKG shows NSR with frequent PVCs. Symptomatic with palpitations associated with dizziness and SOB. Ongoing for the last 1 year but worsening in the last few months.  She underwent event monitor with her PCP and was notified that she had V. tach.  She turned in the monitor recently. I do not have the report to review. She is also scheduled for echocardiogram symptoms week.  Will obtain  event monitor and echocardiogram report from the PCP. In the interim, will start metoprolol tartrate 25 mg twice daily for symptomatic PVCs.  Counseled on caffeine intake, decrease the intake.  # OSA screening -STOP-BANG score is 4.  EKG shows frequent PVCs.  Obtain home sleep study.  I have spent a total of 45 minutes with patient reviewing chart, EKGs, labs and examining patient as well as establishing an assessment and plan that was discussed with the patient.  > 50% of time was spent in direct patient care.    Medication Adjustments/Labs and Tests Ordered: Current medicines are reviewed at length with the patient today.  Concerns regarding medicines are outlined above.   Tests Ordered: Orders Placed This Encounter  Procedures   [facility] Sleep Study    Medication Changes: Meds ordered this encounter  Medications   metoprolol tartrate (LOPRESSOR) 25 MG tablet    Sig: Take 1 tablet (25 mg total) by mouth 2 (two) times daily.    Dispense:  180 tablet    Refill:  2    Disposition:  Follow up  6 months  Signed Nicholl Onstott Verne Spurr, MD, 08/22/2022 1:49 PM    [facility] at [facility] [address] [place], [place], [place] [address]

## 2022-08-24 ENCOUNTER — Encounter: Payer: Self-pay | Admitting: Internal Medicine

## 2022-08-27 ENCOUNTER — Encounter (INDEPENDENT_AMBULATORY_CARE_PROVIDER_SITE_OTHER): Payer: 59 | Admitting: Internal Medicine

## 2022-08-27 DIAGNOSIS — I493 Ventricular premature depolarization: Secondary | ICD-10-CM

## 2022-08-27 DIAGNOSIS — I4729 Other ventricular tachycardia: Secondary | ICD-10-CM

## 2022-08-27 NOTE — Telephone Encounter (Signed)
Please see the MyChart message reply(ies) for my assessment and plan.    This patient gave consent for this Medical Advice Message and is aware that it may result in a bill to [employer], as well as the possibility of receiving a bill for a co-payment or deductible. They are an established patient, but are not seeking medical advice exclusively about a problem treated during an in person or video visit in the last seven days. I did not recommend an in person or video visit within seven days of my reply.    I spent a total of 11 minutes cumulative time within 7 days through [employer].  Gracelee Stemmler Norton Pastel, MD

## 2022-09-04 ENCOUNTER — Encounter (INDEPENDENT_AMBULATORY_CARE_PROVIDER_SITE_OTHER): Payer: 59 | Admitting: Internal Medicine

## 2022-09-04 DIAGNOSIS — I493 Ventricular premature depolarization: Secondary | ICD-10-CM

## 2022-09-05 ENCOUNTER — Telehealth: Payer: Self-pay | Admitting: *Deleted

## 2022-09-05 NOTE — Telephone Encounter (Signed)
Prior Authorization for [facility]AMAR sent to [employer] via web portal. Tracking Number  [id number] PA REQ-  

## 2022-09-07 ENCOUNTER — Encounter: Payer: Self-pay | Admitting: Internal Medicine

## 2022-09-07 NOTE — Telephone Encounter (Signed)
Called patient to advise her that her at home sleep study test did not require a prior auth and that she can go ahead and start test and is aware of instructions, verbalized understanding. She also inquired about Echo results that was supposed to be faxed over advised her that we have not received them and can have PCP re-faxed them. Once Dr. Jenene Slicker reviews them will give her a call.

## 2022-09-10 NOTE — Telephone Encounter (Signed)

## 2022-09-19 ENCOUNTER — Ambulatory Visit (INDEPENDENT_AMBULATORY_CARE_PROVIDER_SITE_OTHER): Payer: 59 | Admitting: Orthopaedic Surgery

## 2022-09-19 ENCOUNTER — Encounter: Payer: Self-pay | Admitting: Orthopaedic Surgery

## 2022-09-19 ENCOUNTER — Other Ambulatory Visit (INDEPENDENT_AMBULATORY_CARE_PROVIDER_SITE_OTHER): Payer: 59

## 2022-09-19 VITALS — BP 153/90 | HR 75 | Ht 64.0 in | Wt 182.0 lb

## 2022-09-19 DIAGNOSIS — M79671 Pain in right foot: Secondary | ICD-10-CM

## 2022-09-19 MED ORDER — METHYLPREDNISOLONE ACETATE 40 MG/ML IJ SUSP
40.0000 mg | Freq: Once | INTRAMUSCULAR | Status: AC
Start: 2022-09-19 — End: 2022-09-19
  Administered 2022-09-19: 40 mg via INTRA_ARTICULAR

## 2022-09-19 NOTE — Progress Notes (Signed)
My right heel hurts.  She has had several weeks of pain in the right medial heel area.  She denies any trauma, no redness, no puncture.  She has done ice, and stretching.  She has little relief.  It is not getting any better.  Right heel has area of medial plantar pain, no swelling, no redness.  NV intact.  ROM of foot and ankle full.  X-rays were done of the right foot, reported separately.  Encounter Diagnosis  Name Primary?   Pain of right heel Yes   Procedure note: After permission from the patient and prep of the medial right heel, I injected 1 % Xylocaine and 1 cc DepoMedrol 40 into the area of pain by sterile technique tolerated well.  Return in two weeks.  Call if any problem.  Precautions discussed.  Electronically Signed Darreld Mclean, MD 7/17/202410:48 AM

## 2022-11-12 ENCOUNTER — Other Ambulatory Visit (HOSPITAL_COMMUNITY): Payer: Self-pay | Admitting: Adult Health

## 2022-11-12 DIAGNOSIS — Z1231 Encounter for screening mammogram for malignant neoplasm of breast: Secondary | ICD-10-CM

## 2022-11-19 ENCOUNTER — Ambulatory Visit (HOSPITAL_COMMUNITY)
Admission: RE | Admit: 2022-11-19 | Discharge: 2022-11-19 | Disposition: A | Discharge status: Home / Self Care | Payer: 59 | Source: Ambulatory Visit | Attending: Adult Health | Admitting: Adult Health

## 2022-11-19 DIAGNOSIS — Z1231 Encounter for screening mammogram for malignant neoplasm of breast: Secondary | ICD-10-CM | POA: Insufficient documentation

## 2023-01-16 ENCOUNTER — Telehealth: Payer: Self-pay | Admitting: Internal Medicine

## 2023-01-16 NOTE — Telephone Encounter (Signed)
Holly Golden with [employer] (quility of Care Department) She stated she was faxing over a Complaint of service for 08/22/2022.  They have attached requesting info.  Just fyi and to be on the look out.  All returning info should be provided on the document.

## 2023-01-21 NOTE — Telephone Encounter (Signed)
Cara from [facility] called in and wanted to make sure all of the documention was just sent over to medical record and the compliant was not looked by the practice admin?  She is faxed the complaint back over to the office just in case the complaint was not look at.    Best number [phone]

## 2023-02-05 ENCOUNTER — Telehealth: Payer: Self-pay | Admitting: Internal Medicine

## 2023-02-05 NOTE — Telephone Encounter (Signed)
Caller Holly Golden) called to follow-up on status of complaint.

## 2023-02-19 ENCOUNTER — Ambulatory Visit: Payer: 59 | Admitting: Internal Medicine

## 2023-03-11 ENCOUNTER — Other Ambulatory Visit: Payer: Self-pay | Admitting: Adult Health

## 2023-03-18 ENCOUNTER — Other Ambulatory Visit (HOSPITAL_COMMUNITY): Payer: Self-pay | Admitting: Podiatry

## 2023-03-18 DIAGNOSIS — S92001A Unspecified fracture of right calcaneus, initial encounter for closed fracture: Secondary | ICD-10-CM

## 2023-03-22 ENCOUNTER — Telehealth: Payer: Self-pay

## 2023-03-22 ENCOUNTER — Ambulatory Visit: Payer: Commercial Managed Care - PPO | Attending: Internal Medicine | Admitting: Internal Medicine

## 2023-03-22 ENCOUNTER — Encounter: Payer: Self-pay | Admitting: Internal Medicine

## 2023-03-22 VITALS — BP 130/58 | HR 69 | Ht 64.0 in | Wt 183.2 lb

## 2023-03-22 DIAGNOSIS — I4729 Other ventricular tachycardia: Secondary | ICD-10-CM | POA: Diagnosis not present

## 2023-03-22 DIAGNOSIS — I351 Nonrheumatic aortic (valve) insufficiency: Secondary | ICD-10-CM | POA: Diagnosis not present

## 2023-03-22 NOTE — Patient Instructions (Signed)

## 2023-03-22 NOTE — Progress Notes (Signed)
Cardiology Office Note  Date: 03/22/2023   ID: Holly Golden, DOB 06-07-1970, MRN [id number]  PCP:  Estanislado Pandy, MD  Cardiologist:  Marjo Bicker, MD Electrophysiologist:  None    History of Present Illness: Holly Golden is a 53 y.o. female with no PMH is here for follow-up visit.  Event monitor showed 15.4% PVC burden and 1 run of NSVT, 4 beat.  9 runs of SVT were also noted.  Echocardiogram showed normal LVEF and mild aortic regurgitation.  She was started on metoprolol 25 mg twice daily.  She is here for follow-up visit.  She continues to have palpitations but did not start metoprolol.  She took second opinion of a different cardiologist and was told metoprolol will only give 10% relief.  She again wants to know if she really needs to take metoprolol.  She did not undergo sleep study either.  No other symptoms of chest pain, DOE, syncope.  She did have dizziness yesterday.  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   PONV (postoperative nausea and vomiting)    Raynaud's disease    Rectal bleeding    Seasonal allergies    Stress 07/14/2014   Swelling    feet or hands   Swelling of hand 11/17/2012   Has bilateral swelling in hands, and they are cold   UTI (urinary tract infection)    Varicose veins     Past Surgical History:  Procedure Laterality Date   CESAREAN SECTION     1991   CHOLECYSTECTOMY     2010 LAPROSCOPIC   COLONOSCOPY  08/06/2011   Procedure: COLONOSCOPY;  Surgeon: Malissa Hippo, MD;  [facility];  Service: Endoscopy;  Laterality: N/A;  730   COLONOSCOPY N/A 04/05/2017   Procedure: COLONOSCOPY;  Surgeon: Malissa Hippo, MD;  [facility];  Service: Endoscopy;  Laterality: N/A;  855-moved to 940 per Ann   COLONOSCOPY WITH PROPOFOL N/A 08/17/2021   Procedure: COLONOSCOPY WITH PROPOFOL;  Surgeon: Malissa Hippo, MD;  [facility];  Service: Endoscopy;  Laterality: N/A;  935   ENDOVENOUS ABLATION SAPHENOUS VEIN W/ LASER Left 12-10-2013   EVLA  left small saphenous vein  and stab phlebectomy <10 Incision left leg   ENDOVENOUS ABLATION SAPHENOUS VEIN W/ LASER Right 12-31-2013   right small saphenous vein  by Gretta Began MD   LAPAROSCOPIC SPLENECTOMY N/A 04/17/2017   Procedure: LAPAROSCOPIC SPLENECTOMY;  Surgeon: Karie Soda, MD;  Location: [facility];  Service: General;  Laterality: N/A;   left tube and ovary removed for a benign mass.     POLYPECTOMY  08/17/2021   Procedure: POLYPECTOMY;  Surgeon: Malissa Hippo, MD;  [facility];  Service: Endoscopy;;   splenic hemangiomas      Current Outpatient Medications  Medication Sig Dispense Refill   acetaminophen (  TYLENOL) 500 MG tablet Take 1,000 mg by mouth every 6 (six) hours as needed for headache.     hydrocortisone-pramoxine (ANALPRAM-HC) 2.5-1 % rectal cream Place 1 Application rectally 3 (three) times daily. 30 g 3   Liniments (DEEP BLUE RELIEF EX) Apply 1 Application topically daily as needed (Muscle pain).     Polyvinyl Alcohol-Povidone PF (REFRESH) 1.4-0.6 % SOLN Place 1 drop into both eyes daily as needed (Dry eye).     fluticasone (FLONASE) 50 MCG/ACT nasal spray Place 1 spray into both nostrils daily as needed for allergies. (Patient not taking: Reported on 09/19/2022)     hydrocortisone (ANUSOL-HC) 2.5 % rectal cream PLACE ONE APPLICATION RECTALLY 3 TIMES DAILY (Patient not taking: Reported on 03/22/2023) 30 g 1   metoprolol tartrate (LOPRESSOR) 25 MG tablet Take  1 tablet (25 mg total) by mouth 2 (two) times daily. (Patient not taking: Reported on 09/19/2022) 180 tablet 2   metroNIDAZOLE (METROGEL) 0.75 % gel Apply 1 application. topically daily as needed (Rosacea). (Patient not taking: Reported on 09/19/2022)     No current facility-administered medications for this visit.   Allergies:  Patient has no known allergies.   Social History: The patient  reports that she has never smoked. She has never used smokeless tobacco. She reports that she does not drink alcohol and does not use drugs.   Family History: The patient's family history includes Anxiety disorder in her mother; Cancer in her cousin, maternal grandmother, and paternal grandmother; Cirrhosis in her maternal grandfather; Colon cancer in her father; Depression in her brother; Diabetes in her cousin and another family member; Healthy in her son and son; Hyperlipidemia in her mother; Obesity in her mother; Seizures in her brother and mother; Sleep apnea in her mother; Stroke in her paternal grandfather; Thyroid disease in her mother.   ROS:  Please see the history of present illness. Otherwise, complete review of systems is positive for none  All other systems are reviewed and negative.   Physical Exam: VS:  BP (!) 130/58 (Cuff Size: Normal)   Pulse 69   Ht 5\' 4"  (1.626 m)   Wt 183 lb 3.2 oz (83.1 kg)   SpO2 98%   BMI 31.45 kg/m , BMI Body mass index is 31.45 kg/m.  Wt Readings from Last 3 Encounters:  03/22/23 183 lb 3.2 oz (83.1 kg)  09/19/22 182 lb (82.6 kg)  08/22/22 182 lb 11.2 oz (82.9 kg)    General: Patient appears comfortable at rest. HEENT: Conjunctiva and lids normal, oropharynx clear with moist mucosa. Neck: Supple, no elevated JVP or carotid bruits, no thyromegaly. Lungs: Clear to auscultation, nonlabored breathing at rest. Cardiac: Regular rate and rhythm, no S3 or significant systolic murmur, no pericardial rub. Abdomen: Soft, nontender, no hepatomegaly, bowel sounds  present, no guarding or rebound. Extremities: No pitting edema, distal pulses 2+. Skin: Warm and dry. Musculoskeletal: No kyphosis. Neuropsychiatric: Alert and oriented x3, affect grossly appropriate.  Recent Labwork: No results found for requested labs within last 365 days.     Component Value Date/Time   CHOL 193 09/29/2021 0912   TRIG 206 (H) 09/29/2021 0912   HDL 34 (L) 09/29/2021 0912   CHOLHDL 5.7 (H) 09/29/2021 0912   CHOLHDL 5.1 08/15/2012 0919   VLDL 50 (H) 08/15/2012 0919   LDLCALC 122 (H) 09/29/2021 0912      Assessment and Plan:  Frequent PVCs, 15.4% PVC burden NSVT, 4 beat OSA screening (STOP-BANG score is 4) Mild aortic regurgitation in 2024   -  Event monitor showed 15.4% PVC burden and 1 run of NSVT, 4 beat.  9 runs of SVT were also noted.  Echocardiogram showed normal LVEF and mild aortic regurgitation.  Patient continues to have palpitations.  EKG today showed NSR, frequent PVCs.  She did not start taking metoprolol as she does not like medications.  Educated about PVCs, NSVT etc.  Patient now wants to take metoprolol.  Continue metoprolol for rate 25 mg twice daily.  Discussed side effects of metoprolol.  Strongly encouraged to get home sleep study. -Echo surveillance every 3 years for aortic regurgitation.  Next echo will be in 2027.    Medication Adjustments/Labs and Tests Ordered: Current medicines are reviewed at length with the patient today.  Concerns regarding medicines are outlined above.   Tests Ordered: Orders Placed This Encounter  Procedures   EKG 12-Lead    Medication Changes: No orders of the defined types were placed in this encounter.   Disposition:  Follow up  6 months  Signed Wahneta Derocher Verne Spurr, MD, 03/22/2023 1:23 PM    [facility] at [facility] [address] [place], [place], [place] [address]

## 2023-03-22 NOTE — Telephone Encounter (Signed)
Patient has new insurance. Please Pre-cert

## 2023-03-26 ENCOUNTER — Ambulatory Visit (HOSPITAL_COMMUNITY)
Admission: RE | Admit: 2023-03-26 | Discharge: 2023-03-26 | Disposition: A | Discharge status: Home / Self Care | Payer: Commercial Managed Care - PPO | Source: Ambulatory Visit | Attending: Podiatry | Admitting: Podiatry

## 2023-03-26 DIAGNOSIS — S92001A Unspecified fracture of right calcaneus, initial encounter for closed fracture: Secondary | ICD-10-CM | POA: Diagnosis present

## 2023-03-26 MED ORDER — GADOBUTROL 1 MMOL/ML IV SOLN
7.0000 mL | Freq: Once | INTRAVENOUS | Status: AC | PRN
  Administered 2023-03-26: 7 mL via INTRAVENOUS

## 2023-05-03 NOTE — Telephone Encounter (Addendum)
 Ordering provider: dr Jenene Slicker Associated diagnoses: G52.33 WatchPAT PA obtained on 05/03/2023 by Latrelle Dodrill, CMA. Authorization: No; tracking ID   No PA REQ ID #[id number] Patient notified of PIN ([phone]) on 05/03/2023 via Notification Method: phone.  Phone note routed to covering staff for follow-up.  Instructions for covering staff:  Please contact patient in 2 weeks if WatchPAT study results are not available yet. Remind patient to complete test.  If patient declines to proceed with test, please confirm that box is unopened and remind patient to return it to the office within 30 days. Route phone note to [facility] SLEEP STUDIES pool for tracking.  If box has been opened, please route phone note to [facility] SLEEP STUDIES pool to have device de-initialized and processed for billing.

## 2023-05-21 ENCOUNTER — Encounter (INDEPENDENT_AMBULATORY_CARE_PROVIDER_SITE_OTHER): Admitting: Cardiology

## 2023-05-21 DIAGNOSIS — G4733 Obstructive sleep apnea (adult) (pediatric): Secondary | ICD-10-CM | POA: Diagnosis not present

## 2023-05-27 ENCOUNTER — Telehealth: Payer: Self-pay | Admitting: Internal Medicine

## 2023-05-27 NOTE — Telephone Encounter (Signed)
 Patient called to follow-up on her sleep test results.

## 2023-05-28 ENCOUNTER — Ambulatory Visit: Attending: Internal Medicine

## 2023-05-28 ENCOUNTER — Telehealth: Payer: Self-pay

## 2023-05-28 DIAGNOSIS — I493 Ventricular premature depolarization: Secondary | ICD-10-CM

## 2023-05-28 DIAGNOSIS — G4733 Obstructive sleep apnea (adult) (pediatric): Secondary | ICD-10-CM

## 2023-05-28 NOTE — Procedures (Signed)
 SLEEP STUDY REPORT Patient Information Study Date: 05/21/2023 Patient Name: Holly Golden Patient ID: [id number] Birth Date: Oct 29, 1970 Age: 53 Gender: Female BMI: 44.2 (W=183 lb, H=4' 6'') Referring Physician: Luane School, MD  TEST DESCRIPTION: Home sleep apnea testing was completed using the WatchPat, a Type 1 device, utilizing  peripheral arterial tonometry (PAT), chest movement, actigraphy, pulse oximetry, pulse rate, body position and snore.  AHI was calculated with apnea and hypopnea using valid sleep time as the denominator. RDI includes apneas,  hypopneas, and RERAs. The data acquired and the scoring of sleep and all associated events were performed in  accordance with the recommended standards and specifications as outlined in the AASM Manual for the Scoring of  Sleep and Associated Events 2.2.0 (2015).   FINDINGS:   1. Mild Obstructive Sleep Apnea with AHI 6.5/hr overall but moderate during REM sleep with REM AHI 27.8/hr.   2. No Central Sleep Apnea with pAHIc 0/hr.   3. Oxygen desaturations as low as 86%.   4. Severe snoring was present. O2 sats were < 88% for 1.3 min.   5. Total sleep time was 6 hrs and 2 min.   6. 22.7% of total sleep time was spent in REM sleep.   7. Shortened sleep onset latency at 6 min.   8. Shortened REM sleep onset latency at 78 min.   9. Total awakenings were 3.  10. Arrhythmia detection: None  DIAGNOSIS: Mild Obstructive Sleep Apnea (G47.33) overall but Moderate Obstructive Sleep Apnea during REM sleep.  RECOMMENDATIONS: 1. Clinical correlation of these findings is necessary. The decision to treat obstructive sleep apnea (OSA) is usually  based on the presence of apnea symptoms or the presence of associated medical conditions such as Hypertension,  Congestive Heart Failure, Atrial Fibrillation or Obesity. The most common symptoms of OSA are snoring, gasping for  breath while sleeping, daytime sleepiness and fatigue.   2. Initiating  apnea therapy is recommended given the presence of symptoms and/or associated conditions.  Recommend proceeding with one of the following:   a. Auto-CPAP therapy with a pressure range of 5-20cm H2O.   b. An oral appliance (OA) that can be obtained from certain dentists with expertise in sleep medicine. These are  primarily of use in non-obese patients with mild and moderate disease.   c. An ENT consultation which may be useful to look for specific causes of obstruction and possible treatment  options.   d. If patient is intolerant to PAP therapy, consider referral to ENT for evaluation for hypoglossal nerve stimulator.   3. Close follow-up is necessary to ensure success with CPAP or oral appliance therapy for maximum benefit .  4. A follow-up oximetry study on CPAP is recommended to assess the adequacy of therapy and determine the need  for supplemental oxygen or the potential need for Bi-level therapy. An arterial blood gas to determine the adequacy of  baseline ventilation and oxygenation should also be considered.  5. Healthy sleep recommendations include: adequate nightly sleep (normal 7-9 hrs/night), avoidance of caffeine after  noon and alcohol near bedtime, and maintaining a sleep environment that is cool, dark and quiet.  6. Weight loss for overweight patients is recommended. Even modest amounts of weight loss can significantly  improve the severity of sleep apnea.  7. Snoring recommendations include: weight loss where appropriate, side sleeping, and avoidance of alcohol before  bed.  8. Operation of motor vehicle should be avoided when sleepy.  Signature: Armanda Magic, MD; [facility]; Diplomat, American Board  of Sleep  Medicine Electronically Signed: 05/28/2023 10:20:03 AM

## 2023-05-28 NOTE — Telephone Encounter (Signed)
-----   Message from Armanda Magic sent at 05/28/2023 10:21 AM EDT ----- Please let patient know that they have sleep apnea.  Recommend therapeutic CPAP titration for treatment of patient's sleep disordered breathing.

## 2023-05-28 NOTE — Telephone Encounter (Signed)
 Notified patient of sleep study results and recommendations. All question (if any) were answered. Patient verbalized understanding. Patient stated that current insurance will terminate on 06/03/23 and new insurance will start  07/12/23. Patient will call back with new insurance information so patient can be scheduled for CPAP Titration.

## 2023-05-29 ENCOUNTER — Other Ambulatory Visit: Payer: Self-pay | Admitting: Adult Health

## 2023-05-29 DIAGNOSIS — Z131 Encounter for screening for diabetes mellitus: Secondary | ICD-10-CM

## 2023-05-29 DIAGNOSIS — Z1329 Encounter for screening for other suspected endocrine disorder: Secondary | ICD-10-CM

## 2023-05-29 DIAGNOSIS — R5383 Other fatigue: Secondary | ICD-10-CM

## 2023-05-29 DIAGNOSIS — E782 Mixed hyperlipidemia: Secondary | ICD-10-CM

## 2023-05-29 DIAGNOSIS — Z01419 Encounter for gynecological examination (general) (routine) without abnormal findings: Secondary | ICD-10-CM

## 2023-05-29 DIAGNOSIS — Z1321 Encounter for screening for nutritional disorder: Secondary | ICD-10-CM

## 2023-05-29 NOTE — Progress Notes (Signed)
 Labs ordered.

## 2023-05-31 NOTE — Addendum Note (Signed)
 Addended by: Reesa Chew on: 05/31/2023 05:49 PM   Modules accepted: Orders

## 2023-06-04 LAB — VITAMIN D 25 HYDROXY (VIT D DEFICIENCY, FRACTURES): Vit D, 25-Hydroxy: 25.2 ng/mL — ABNORMAL LOW (ref 30.0–100.0)

## 2023-06-04 LAB — COMPREHENSIVE METABOLIC PANEL WITH GFR
ALT: 12 IU/L (ref 0–32)
AST: 16 IU/L (ref 0–40)
Albumin: 4.4 g/dL (ref 3.8–4.9)
Alkaline Phosphatase: 102 IU/L (ref 44–121)
BUN/Creatinine Ratio: 15 (ref 9–23)
BUN: 13 mg/dL (ref 6–24)
Bilirubin Total: 0.5 mg/dL (ref 0.0–1.2)
CO2: 22 mmol/L (ref 20–29)
Calcium: 9.9 mg/dL (ref 8.7–10.2)
Chloride: 104 mmol/L (ref 96–106)
Creatinine, Ser: 0.85 mg/dL (ref 0.57–1.00)
Globulin, Total: 2.8 g/dL (ref 1.5–4.5)
Glucose: 89 mg/dL (ref 70–99)
Potassium: 4.7 mmol/L (ref 3.5–5.2)
Sodium: 139 mmol/L (ref 134–144)
Total Protein: 7.2 g/dL (ref 6.0–8.5)
eGFR: 82 mL/min/{1.73_m2} (ref >59)

## 2023-06-04 LAB — LIPID PANEL
Chol/HDL Ratio: 5.6 ratio — ABNORMAL HIGH (ref 0.0–4.4)
Cholesterol, Total: 197 mg/dL (ref 100–199)
HDL: 35 mg/dL — ABNORMAL LOW (ref >39)
LDL Chol Calc (NIH): 126 mg/dL — ABNORMAL HIGH (ref 0–99)
Triglycerides: 201 mg/dL — ABNORMAL HIGH (ref 0–149)
VLDL Cholesterol Cal: 36 mg/dL (ref 5–40)

## 2023-06-04 LAB — HEMOGLOBIN A1C
Est. average glucose Bld gHb Est-mCnc: 111 mg/dL
Hgb A1c MFr Bld: 5.5 % (ref 4.8–5.6)

## 2023-06-04 LAB — TSH+FREE T4
Free T4: 1.05 ng/dL (ref 0.82–1.77)
TSH: 4.47 u[IU]/mL (ref 0.450–4.500)

## 2023-06-04 LAB — CBC
Hematocrit: 44.4 % (ref 34.0–46.6)
Hemoglobin: 14.6 g/dL (ref 11.1–15.9)
MCH: 29.1 pg (ref 26.6–33.0)
MCHC: 32.9 g/dL (ref 31.5–35.7)
MCV: 89 fL (ref 79–97)
Platelets: 481 10*3/uL — ABNORMAL HIGH (ref 150–450)
RBC: 5.01 x10E6/uL (ref 3.77–5.28)
RDW: 13.5 % (ref 11.7–15.4)
WBC: 5.3 10*3/uL (ref 3.4–10.8)

## 2023-06-04 LAB — VITAMIN B12: Vitamin B-12: 418 pg/mL (ref 232–1245)

## 2023-06-26 ENCOUNTER — Encounter: Payer: Self-pay | Admitting: Orthopaedic Surgery

## 2023-06-26 ENCOUNTER — Ambulatory Visit (INDEPENDENT_AMBULATORY_CARE_PROVIDER_SITE_OTHER): Payer: Self-pay | Admitting: Orthopaedic Surgery

## 2023-06-26 ENCOUNTER — Other Ambulatory Visit: Payer: Self-pay

## 2023-06-26 VITALS — BP 130/91 | HR 89 | Ht 64.0 in | Wt 183.0 lb

## 2023-06-26 DIAGNOSIS — M545 Low back pain, unspecified: Secondary | ICD-10-CM

## 2023-06-26 MED ORDER — HYDROCODONE-ACETAMINOPHEN 5-325 MG PO TABS: 1.0000 | ORAL_TABLET | ORAL | 0 refills | Status: AC | PRN

## 2023-06-26 MED ORDER — PREDNISONE 5 MG (21) PO TBPK: ORAL_TABLET | ORAL | 0 refills | Status: DC

## 2023-06-26 MED ORDER — CYCLOBENZAPRINE HCL 10 MG PO TABS: 10.0000 mg | ORAL_TABLET | Freq: Every day | ORAL | 0 refills | Status: DC

## 2023-06-26 NOTE — Progress Notes (Signed)
 My back hurts.  She was putting a 10 pound ham into her oven on Easter Sunday when she experienced marked pain of the lower back radiating to the right foot.  She was able to finish the cooking and have dinner but she has been in pain since then.  She has no trauma.  She has numbness and weakness of the right leg.  Nothing seems to help it.  She has tried ice, heat, rest.  She has history of lower back pain in the past.  Exam shows decreased ROM of the lower back secondary to pain, no spasm, muscle tone and strength are normal, SLR positive at 30 on the right, negative on the left, NV intact, gait is slow but steady.  X-rays were done of the lumbar spine, reported separately.  Encounter Diagnosis  Name Primary?   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  I will give prednisone  dose pack, Norco and Flexeril .  She may need MRI if not improved.  Return in one week.  Call if any problem.  Precautions discussed.  Electronically Signed Pleasant Brilliant, MD 4/23/20253:13 PM

## 2023-07-03 ENCOUNTER — Ambulatory Visit: Payer: Self-pay | Admitting: Orthopaedic Surgery

## 2023-07-03 ENCOUNTER — Other Ambulatory Visit: Payer: Self-pay | Admitting: Orthopaedic Surgery

## 2023-07-03 DIAGNOSIS — M545 Low back pain, unspecified: Secondary | ICD-10-CM

## 2023-07-17 ENCOUNTER — Encounter: Payer: Self-pay | Admitting: Adult Health

## 2023-07-17 ENCOUNTER — Ambulatory Visit: Payer: Self-pay | Admitting: Adult Health

## 2023-07-17 VITALS — BP 127/77 | HR 75 | Ht 64.0 in | Wt 184.5 lb

## 2023-07-17 DIAGNOSIS — R232 Flushing: Secondary | ICD-10-CM

## 2023-07-17 DIAGNOSIS — Z1331 Encounter for screening for depression: Secondary | ICD-10-CM

## 2023-07-17 DIAGNOSIS — Z01419 Encounter for gynecological examination (general) (routine) without abnormal findings: Secondary | ICD-10-CM

## 2023-07-17 DIAGNOSIS — Z780 Asymptomatic menopausal state: Secondary | ICD-10-CM | POA: Insufficient documentation

## 2023-07-17 NOTE — Progress Notes (Signed)
 Patient ID: Holly Golden, female   DOB: [birth year], 53 y.o.   MRN: [id number] History of Present Illness: Holly Golden is a 53 year old white female, married, PM in for a well woman gyn exam. She is having hot flashes and not sleeping well and has dry nose and mouth, has seen ENT. Has had back issues too, is better. Has had itching near clitoris area at times.     Component Value Date/Time   DIAGPAP  09/29/2021 0934    - Negative for intraepithelial lesion or malignancy (NILM)   DIAGPAP  03/22/2017 0000    NEGATIVE FOR INTRAEPITHELIAL LESIONS OR MALIGNANCY.   HPVHIGH Negative 09/29/2021 0934   ADEQPAP  09/29/2021 0934    Satisfactory for evaluation; transformation zone component PRESENT.   ADEQPAP  03/22/2017 0000    Satisfactory for evaluation  endocervical/transformation zone component PRESENT.   PCP is Dr Marykay Snipes    Current Medications, Allergies, Past Medical History, Past Surgical History, Family History and Social History were reviewed in [employer] electronic medical record.     Review of Systems: Patient denies any headaches, hearing loss, fatigue, blurred vision, shortness of breath, chest pain, abdominal pain, problems with bowel movements, urination, or intercourse(not active). No joint pain or mood swings.  Denies any vaginal bleeding See HPI for positives   Physical Exam:BP 127/77 (BP Location: Right Arm, Patient Position: Sitting, Cuff Size: Normal)   Pulse 75   Ht 5\' 4"  (1.626 m)   Wt 184 lb 8 oz (83.7 kg)   LMP 06/29/2021   BMI 31.67 kg/m   General:  Well developed, well nourished, no acute distress Skin:  Warm and dry Neck:  Midline trachea, normal thyroid , good ROM, no lymphadenopathy Lungs; Clear to auscultation bilaterally Breast:  No dominant palpable mass, retraction, or nipple discharge Cardiovascular: Regular rate and rhythm Abdomen:  Soft, non tender, no hepatosplenomegaly Pelvic:  External genitalia is normal in appearance, no lesions.  The vagina  is pale. Urethra has no lesions or masses. The cervix is smooth.  Uterus is felt to be normal size, shape, and contour.  No adnexal masses or tenderness noted.Bladder is non tender, no masses felt. Rectal: Deferred at her request  Extremities/musculoskeletal:  No swelling or varicosities noted, no clubbing or cyanosis Psych:  No mood changes, alert and cooperative,seems happy AA is 0 Fall risk is low    07/17/2023    8:46 AM 09/29/2021    9:33 AM 10/09/2018    8:45 AM  Depression screen PHQ 2/9  Decreased Interest 0 0 0  Down, Depressed, Hopeless 0 0 1  PHQ - 2 Score 0 0 1  Altered sleeping 3 0   Tired, decreased energy 3 0   Change in appetite 0 0   Feeling bad or failure about yourself  0 0   Trouble concentrating 1 0   Moving slowly or fidgety/restless 0 0   Suicidal thoughts 0 0   PHQ-9 Score 7 0        07/17/2023    8:47 AM 09/29/2021    9:36 AM  GAD 7 : Generalized Anxiety Score  Nervous, Anxious, on Edge 1 2  Control/stop worrying 0 0  Worry too much - different things 0 0  Trouble relaxing 1 2  Restless 0 0  Easily annoyed or irritable 1 2  Afraid - awful might happen 0 0  Total GAD 7 Score 3 6      Upstream - 07/17/23 4098  Pregnancy Intention Screening   Does the patient want to become pregnant in the next year? N/A    Does the patient's partner want to become pregnant in the next year? N/A    Would the patient like to discuss contraceptive options today? N/A      Contraception Wrap Up   Current Method Abstinence;Vasectomy   PM   End Method Abstinence;Vasectomy   PM   Contraception Counseling Provided No             Examination chaperoned by Alphonso Aschoff LPN    Impression and plan: 1. Encounter for well woman exam with routine gynecological exam (Primary) Pap and physical in 1 year Mammogram was negative 11/19/22 Colonoscopy  due 2028 Review labs  2. Postmenopause Denies any vaginal bleeding   3. Hot flashes Declines HRT or SSRI at this  time  Dress in layers  Use fans when can  Try to walk more

## 2023-08-09 ENCOUNTER — Other Ambulatory Visit: Payer: Self-pay | Admitting: Adult Health

## 2023-08-28 ENCOUNTER — Telehealth: Payer: Self-pay | Admitting: Internal Medicine

## 2023-08-28 MED ORDER — METOPROLOL TARTRATE 25 MG PO TABS: 25.0000 mg | ORAL_TABLET | Freq: Two times a day (BID) | ORAL | 1 refills | Status: AC

## 2023-08-28 NOTE — Telephone Encounter (Signed)
 RX sent to requested Pharmacy

## 2023-08-28 NOTE — Telephone Encounter (Signed)
 1. Which medications need to be refilled? (please list name of each medication and dose if known) Metoprolol  25mg   2. Which pharmacy/location (including street and city if local pharmacy) is medication to be sent to?  [place]  3. Do they need a 30 day or 90 day supply? 90    Pt does not need right away but will need soon and does not want to run out!

## 2023-09-11 ENCOUNTER — Telehealth: Payer: Self-pay

## 2023-09-11 DIAGNOSIS — G4733 Obstructive sleep apnea (adult) (pediatric): Secondary | ICD-10-CM

## 2023-09-11 NOTE — Telephone Encounter (Signed)
**Note De-Identified Dontarius Sheley Obfuscation** I started a CPAP Titration PA through the [employer] provider portal and received the following message:  APAP-Order ID: [id number] Authorized Approval Valid Through:09/11/2023 - 12/09/2023  Forwarding to Dr Shlomo for her advisement.

## 2023-09-12 NOTE — Telephone Encounter (Signed)
 The patient has been notified of the result and verbalized understanding.  All questions (if any) were answered. Joshua Dalton Seip, CMA 09/12/2023 3:08 PM    Per Dr Coletta [facility] CPAP on auto from 4 to 15cm H2O with heated humidity, mask of choice    Upon patient request DME selection is [facility] Patient understands he will be contacted by [facility] to set up his cpap. Patient understands to call if [facility] does not contact him with new setup in a timely manner. Patient understands they will be called once confirmation has been received from [facility] that they have received their new machine to schedule 10 week follow up appointment.   [facility] notified of new cpap order  Please add to airview Patient was grateful for the call and thanked me.

## 2023-10-08 ENCOUNTER — Encounter: Payer: Self-pay | Admitting: Internal Medicine

## 2023-10-08 ENCOUNTER — Ambulatory Visit: Attending: Internal Medicine | Admitting: Internal Medicine

## 2023-10-08 VITALS — BP 124/82 | HR 58 | Ht 64.0 in | Wt 189.8 lb

## 2023-10-08 DIAGNOSIS — I493 Ventricular premature depolarization: Secondary | ICD-10-CM

## 2023-10-08 DIAGNOSIS — Z136 Encounter for screening for cardiovascular disorders: Secondary | ICD-10-CM

## 2023-10-08 DIAGNOSIS — I4729 Other ventricular tachycardia: Secondary | ICD-10-CM | POA: Diagnosis not present

## 2023-10-08 NOTE — Progress Notes (Signed)
 Cardiology Office Note  Date: 10/08/2023   ID: Holly Golden, DOB Feb 25, 1971, MRN [id number]  PCP:  Holly Deward ORN, MD  Cardiologist:  Holly SHAUNNA Maywood, MD Electrophysiologist:  None    History of Present Illness: Holly Golden is a 53 y.o. female with 15.4% PVC burden, mild OSA is here for follow-up visit.  Event monitor showed 15.4% PVC burden and 1 run of NSVT, 4 beat.  9 runs of SVT were also noted. Echocardiogram showed normal LVEF and mild aortic regurgitation.  She was started on metoprolol  25 mg twice daily.  She is here for follow-up visit.    After starting metoprolol , does not have any more palpitations except occasionally. EKG today showed NSR and PVCs have resolved compared to EKG from January 2025.  She was diagnosed with mild OSA, following up with Dr. Randine Bihari to start CPAP.  She has no other symptoms of angina or DOE.  No dizziness, syncope either.  She is asking to get CT cardiac calcium scoring test as everyone around her is getting it.   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   PONV (postoperative nausea and vomiting)    PVC (premature ventricular contraction)    Raynaud's disease    Rectal bleeding    Seasonal allergies    Stress 07/14/2014   Swelling    feet or hands   Swelling of hand 11/17/2012   Has bilateral swelling in hands, and they are cold   UTI (urinary tract infection)    Varicose veins     Past Surgical History:  Procedure Laterality Date   CESAREAN  SECTION     1991   CHOLECYSTECTOMY     2010 LAPROSCOPIC   COLONOSCOPY  08/06/2011   Procedure: COLONOSCOPY;  Surgeon: Claudis Golden Rivet, MD;  [facility]: [facility];  Service: Endoscopy;  Laterality: N/A;  730   COLONOSCOPY N/A 04/05/2017   Procedure: COLONOSCOPY;  Surgeon: Rivet Claudis RAYMOND, MD;  Location: [facility];  Service: Endoscopy;  Laterality: N/A;  855-moved to 940 per Ann   COLONOSCOPY WITH PROPOFOL  N/A 08/17/2021   Procedure: COLONOSCOPY WITH PROPOFOL ;  Surgeon: Rivet Claudis RAYMOND, MD;  Location: [facility];  Service: Endoscopy;  Laterality: N/A;  935   ENDOVENOUS ABLATION SAPHENOUS VEIN W/ LASER Left 12-10-2013   EVLA  left small saphenous vein  and stab phlebectomy <10 Incision left leg   ENDOVENOUS ABLATION SAPHENOUS VEIN W/ LASER Right 12-31-2013   right small saphenous vein  by Krystal Doing MD   LAPAROSCOPIC SPLENECTOMY N/A 04/17/2017   Procedure: LAPAROSCOPIC SPLENECTOMY;  Surgeon: Sheldon Standing, MD;  Location: [facility];  Service: General;  Laterality: N/A;   left tube and ovary removed for a benign mass.     POLYPECTOMY  08/17/2021   Procedure: POLYPECTOMY;  Surgeon: Rivet Claudis RAYMOND, MD;  Location: [facility];  Service: Endoscopy;;   splenic hemangiomas      Current Outpatient Medications  Medication Sig Dispense Refill   acetaminophen  (TYLENOL ) 500 MG tablet Take 1,000 mg by mouth every 6 (six) hours as needed for headache.     cyclobenzaprine  (FLEXERIL ) 10 MG tablet Take 1 tablet (10 mg total) by mouth at bedtime. One tablet every night at bedtime as needed for spasm. 30 tablet 0   hydrocortisone  (ANUSOL -HC) 2.5 % rectal cream PLACE ONE APPLICATION RECTALLY 3 TIMES DAILY 30 g 1   Liniments (DEEP BLUE RELIEF EX) Apply 1 Application topically daily as needed (Muscle pain).     metoprolol  tartrate (LOPRESSOR ) 25 MG tablet Take 1 tablet (25 mg total) by mouth 2 (two) times daily. 180 tablet 1   VITAMIN D  PO Take by mouth.     No current facility-administered medications for  this visit.   Allergies:  Patient has no known allergies.   Social History: The patient  reports that she has never smoked. She has never used smokeless tobacco. She reports that she does not drink alcohol and does not use drugs.   Family History: The patient's family history includes Anxiety disorder in her mother; Cancer in her cousin, maternal grandmother, and paternal grandmother; Cirrhosis in her maternal grandfather; Colon cancer in her father; Depression in her brother; Diabetes in her cousin and another family member; Healthy in her son and son; Hyperlipidemia in her mother; Obesity in her mother; Seizures in her brother and mother; Sleep apnea in her mother; Stroke in her paternal grandfather; Thyroid  disease in her mother.   ROS:  Please see the history of present illness. Otherwise, complete review of systems is positive for none  All other systems are reviewed and negative.   Physical Exam: VS:  LMP 06/29/2021 , BMI There is no height or weight on file to calculate BMI.  Wt Readings from Last 3 Encounters:  07/17/23 184 lb 8 oz (83.7 kg)  06/26/23 183 lb (83 kg)  03/22/23 183 lb 3.2 oz (83.1 kg)    General: Patient appears comfortable at rest. HEENT: Conjunctiva and lids normal, oropharynx clear with moist mucosa. Neck: Supple, no elevated JVP or carotid bruits, no thyromegaly. Lungs: Clear to auscultation, nonlabored breathing at rest. Cardiac: Regular rate and rhythm, no S3 or significant systolic murmur, no pericardial rub. Abdomen: Soft, nontender, no hepatomegaly, bowel sounds present, no guarding or rebound. Extremities: No pitting edema, distal pulses 2+. Skin: Warm and dry. Musculoskeletal: No kyphosis. Neuropsychiatric: Alert and oriented x3, affect grossly appropriate.  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  Reports occasional palpitations.  No dizziness, lightheadedness, syncope. - EKG today showed NSR.  PVCs resolved compared to EKG from January 2025. - Diagnosed with mild OSA, following with Dr. Wilbert Bihari for CPAP titration.  Mild OSA - Following up with Dr. Wilbert Bihari for CPAP titration.  Screening of CAD - Patient wants to get CT cardiac calcium scoring test. -  Obtain CT cardiac calcium scoring test.  Mild aortic regurgitation in 2024 - Asymptomatic, repeat echocardiogram in 3 to 5 years.  I spent 30 minutes in reviewing prior notes, reports, labs, speaking with the patient and documenting. I answered all the questions.  Medication Adjustments/Labs and Tests Ordered: Current medicines are reviewed at length with the patient today.  Concerns regarding medicines are outlined above.   Tests Ordered: Orders Placed This Encounter  Procedures   EKG 12-Lead    Medication Changes: No orders of the defined types were placed in this encounter.   Disposition:  Follow up 1 year  Signed Kaydin Karbowski Priya Kallie Depolo, MD, 10/08/2023 9:04 AM    [facility] [address] [place], [place], [place] [address]

## 2023-10-08 NOTE — Patient Instructions (Addendum)
 Medication Instructions:  Your physician recommends that you continue on your current medications as directed. Please refer to the Current Medication list given to you today.   Labwork: None  Testing/Procedures: Non-Cardiac CT scanning, (CAT scanning), is a noninvasive, special x-ray that produces cross-sectional images of the body using x-rays and a computer. CT scans help physicians diagnose and treat medical conditions. For some CT exams, a contrast material is used to enhance visibility in the area of the body being studied. CT scans provide greater clarity and reveal more details than regular x-ray exams.   Follow-Up: Your physician recommends that you schedule a follow-up appointment in: 1 year. You will receive a reminder call in about 8 months reminding you to schedule your appointment. If you don't receive this call, please contact our office.   Any Other Special Instructions Will Be Listed Below (If Applicable). Thank you for choosing [facility]!     If you need a refill on your cardiac medications before your next appointment, please call your pharmacy.

## 2023-10-22 ENCOUNTER — Other Ambulatory Visit (HOSPITAL_COMMUNITY): Payer: Self-pay | Admitting: Adult Health

## 2023-10-22 DIAGNOSIS — Z1231 Encounter for screening mammogram for malignant neoplasm of breast: Secondary | ICD-10-CM

## 2023-10-23 ENCOUNTER — Ambulatory Visit (HOSPITAL_COMMUNITY)
Admission: RE | Admit: 2023-10-23 | Discharge: 2023-10-23 | Disposition: A | Discharge status: Home / Self Care | Payer: Self-pay | Source: Ambulatory Visit | Attending: Internal Medicine | Admitting: Internal Medicine

## 2023-10-23 DIAGNOSIS — Z136 Encounter for screening for cardiovascular disorders: Secondary | ICD-10-CM | POA: Insufficient documentation

## 2023-10-24 ENCOUNTER — Ambulatory Visit: Payer: Self-pay | Admitting: Internal Medicine

## 2023-10-25 ENCOUNTER — Encounter: Payer: Self-pay | Admitting: Radiology

## 2023-11-25 ENCOUNTER — Ambulatory Visit (HOSPITAL_COMMUNITY)
Admission: RE | Admit: 2023-11-25 | Discharge: 2023-11-25 | Disposition: A | Discharge status: Home / Self Care | Source: Ambulatory Visit | Attending: Adult Health | Admitting: Adult Health

## 2023-11-25 DIAGNOSIS — Z1231 Encounter for screening mammogram for malignant neoplasm of breast: Secondary | ICD-10-CM | POA: Diagnosis not present

## 2023-11-26 ENCOUNTER — Ambulatory Visit: Payer: Self-pay | Admitting: Adult Health

## 2024-01-06 ENCOUNTER — Encounter: Payer: Self-pay | Admitting: Radiology

## 2024-04-02 ENCOUNTER — Encounter: Payer: Self-pay | Admitting: Dermatology

## 2024-04-02 ENCOUNTER — Ambulatory Visit: Admitting: Dermatology

## 2024-04-02 DIAGNOSIS — L661 Lichen planopilaris, unspecified: Secondary | ICD-10-CM

## 2024-04-02 DIAGNOSIS — L658 Other specified nonscarring hair loss: Secondary | ICD-10-CM | POA: Diagnosis not present

## 2024-04-02 DIAGNOSIS — Z79899 Other long term (current) drug therapy: Secondary | ICD-10-CM | POA: Diagnosis not present

## 2024-04-02 DIAGNOSIS — L649 Androgenic alopecia, unspecified: Secondary | ICD-10-CM | POA: Diagnosis not present

## 2024-04-02 DIAGNOSIS — L659 Nonscarring hair loss, unspecified: Secondary | ICD-10-CM

## 2024-04-02 MED ORDER — FINASTERIDE 5 MG PO TABS: 2.5000 mg | ORAL_TABLET | Freq: Every day | ORAL | 4 refills | Status: AC

## 2024-04-02 MED ORDER — MINOXIDIL 2.5 MG PO TABS: 1.2500 mg | ORAL_TABLET | Freq: Every day | ORAL | 4 refills | Status: AC

## 2024-04-02 NOTE — Progress Notes (Signed)
 "  New Patient Visit   Patient (and/or pt guardian) consented to the use of AI-assisted tools for note generation.  Subjective  Holly Golden is a 54 y.o. female who presents for the following: Hair loss   Located at the scalp that she would would like to have examined.  Patient reports the areas have been there for 20 years She reports the areas of hair loss feel tender, irritated and reports that scalp is always red She states that the areas of loss are frontal hair line and at the crown of her head Patient reports she has previously been treated for these areas Patient reports she was prescribed oral finasteride , half a tablet a day.  Patient states she ran our of this medication in November. Prior to that she had taken it for a month after a one year break from taking it for two years. Patient reports she did notice that the oral medication helped some in reducing shedding and hair growth. Patient reports she tried rogaine  in the past but reports she only used it for a few months and did not see improvement Patient reports she uses head and shoulders shampoo Patient reports she has not previously worn tight hair styles  Patient reports she does take vitamin C and D  The following portions of the chart were reviewed this encounter and updated as appropriate: medications, allergies, medical history  Review of Systems:  No other skin or systemic complaints except as noted in HPI or Assessment and Plan.  Objective  Well appearing patient in no apparent distress; mood and affect are within normal limits.  A focused examination was performed of the following areas: scalp  Relevant exam findings are noted in the Assessment and Plan.           Mid Frontal Scalp  Erythematous      Assessment & Plan  ANDROGENETIC ALOPECIA (FEMALE PATTERN HAIR LOSS) Exam: Diffuse thinning of the crown and widening of the midline part with retention of the frontal hairline  Patient Education  Discussed During Visit: Female Androgenic Alopecia is a chronic condition related to genetics and/or hormonal changes.  In women androgenetic alopecia is commonly associated with menopause but may occur any time after puberty.  It causes hair thinning primarily on the crown with widening of the part and temporal hairline recession.  Pt presents with Chronic androgenetic alopecia with recent exacerbation of scalp inflammation, characterized by redness and itching. Previous treatment with oral finasteride  was effective, but regrowth was lost after discontinuation. Differential diagnosis includes inflammatory alopecia, which could lead to scarring if untreated. Biopsy is necessary to rule out inflammatory component.   Treatment Plan: Prescribed oral minoxidil  1.25 mg total daily (half of a 2.5 mg tablet) and oral Finasteride  2.5 mg total daily (half of a 5 mg tablet) Recommended Vital Proteins Collagen supplement and Viviscal hair growth supplement   Bar term medication management.  Patient is using [place] term (months to years) prescription medication  to control their dermatologic condition.  These medications require periodic monitoring to evaluate for efficacy and side effects and may require periodic laboratory monitoring.    Doses of oral minoxidil  for hair loss are considered low dose. This is because the doses used for hair loss are much lower than the doses which are used for conditions such as high blood pressure (hypertension). The doses used for hypertension are 10-40mg  per day.  Side effects are uncommon at the low doses (up to 2.5 mg/day) used to treat  hair loss. Potential side effects, more commonly seen at higher doses, include: Increase in hair growth (hypertrichosis) elsewhere on face and body Temporary hair shedding upon starting medication which may last up to 4 weeks Ankle swelling, fluid retention, rapid weight gain more than 5 pounds Low blood pressure and feeling lightheaded  or dizzy when standing up quickly Fast or irregular heartbeat Headaches  NEOPLASM OF UNCERTAIN BEHAVIOR OF SKIN Mid Frontal Scalp - Skin / nail biopsy Type of biopsy: punch   Informed consent: discussed and consent obtained   Timeout: patient name, date of birth, surgical site, and procedure verified   Procedure prep:  Patient was prepped and draped in usual sterile fashion Prep type:  Isopropyl alcohol Anesthesia: the lesion was anesthetized in a standard fashion   Anesthetic:  1% lidocaine  w/ epinephrine  1-100,000 buffered w/ 8.4% NaHCO3 Punch size:  4 mm Suture size:  4-0 Suture type: Prolene (polypropylene)   Hemostasis achieved with: suture and aluminum chloride   Outcome: patient tolerated procedure well   Post-procedure details: sterile dressing applied and wound care instructions given   Dressing type: petrolatum gauze    Specimen A - Surgical pathology Differential Diagnosis: r/o LPP vs Androgenetic alopecia vs both  Check Margins: No  Return in about 2 weeks (around 04/16/2024) for suture removal & bx f/u.  Holly Golden, am acting as a neurosurgeon for [employer], DO .   Documentation: I have reviewed the above documentation for accuracy and completeness, and I agree with the above.  Delon Lenis, DO     "

## 2024-04-02 NOTE — Patient Instructions (Addendum)
 VISIT SUMMARY:  During your visit, we discussed your hair thinning and scalp redness. You have been experiencing these symptoms for a while, and we reviewed your previous treatments and current medications. We performed a scalp biopsy to better understand the cause of your scalp inflammation and discussed potential treatment options moving forward.  YOUR PLAN:  -ANDROGENETIC ALOPECIA WITH SCALP INFLAMMATION: Androgenetic alopecia is a common form of hair loss in both men and women. It is often hereditary and can be accompanied by scalp inflammation. We performed a scalp biopsy to check for any inflammatory conditions. You are to continue taking oral finasteride  2.5 mg daily and start taking oral minoxidil  1.25 mg daily.  We will monitor for any side effects such as lightheadedness, dizziness, and rapid heart rate. If you experience side effects, we may switch to topical minoxidil . Depending on the biopsy results, we may also consider using oral anti-inflammatory medications like Plaquenil or doxycycline .  -ERYTHEMATOUS SCALP:  Redness / pinkness on the scalp that can be a sign of inflammation. We performed a biopsy near this area to check for underlying inflammation. If the biopsy indicates inflammation, we may consider treating it with low-dose doxycycline  for its anti-inflammatory effects.  INSTRUCTIONS:  We will follow up with you once we have the biopsy results to discuss the next steps in your treatment plan.         Patient Handout: Wound Care for Skin Biopsy Site  Taking Care of Your Skin Biopsy Site  Proper care of the biopsy site is essential for promoting healing and minimizing scarring. This handout provides instructions on how to care for your biopsy site to ensure optimal recovery.  1. Cleaning the Wound:  Clean the biopsy site daily with gentle soap and water . Gently pat the area dry with a clean, soft towel. Avoid harsh scrubbing or rubbing the area, as this can  irritate the skin and delay healing.  2. Applying Aquaphor and Bandage:  After cleaning the wound, apply a thin layer of Aquaphor ointment to the biopsy site. Cover the area with a sterile bandage to protect it from dirt, bacteria, and friction. Change the bandage daily or as needed if it becomes soiled or wet.  3. Continued Care for One Week:  Repeat the cleaning, Aquaphor application, and bandaging process daily for one week following the biopsy procedure. Keeping the wound clean and moist during this initial healing period will help prevent infection and promote optimal healing.  4. Massaging Aquaphor into the Area:  ---After one week, discontinue the use of bandages but continue to apply Aquaphor to the biopsy site. ----Gently massage the Aquaphor into the area using circular motions. ---Massaging the skin helps to promote circulation and prevent the formation of scar tissue.   Additional Tips:  Avoid exposing the biopsy site to direct sunlight during the healing process, as this can cause hyperpigmentation or worsen scarring. If you experience any signs of infection, such as increased redness, swelling, warmth, or drainage from the wound, contact your healthcare provider immediately. Follow any additional instructions provided by your healthcare provider for caring for the biopsy site and managing any discomfort. Conclusion:  Taking proper care of your skin biopsy site is crucial for ensuring optimal healing and minimizing scarring. By following these instructions for cleaning, applying Aquaphor, and massaging the area, you can promote a smooth and successful recovery. If you have any questions or concerns about caring for your biopsy site, don't hesitate to contact your healthcare provider for guidance.  Important Information  Due to recent changes in healthcare laws, you may see results of your pathology and/or laboratory studies on MyChart before the doctors have had a  chance to review them. We understand that in some cases there may be results that are confusing or concerning to you. Please understand that not all results are received at the same time and often the doctors may need to interpret multiple results in order to provide you with the best plan of care or course of treatment. Therefore, we ask that you please give us  2 business days to thoroughly review all your results before contacting the office for clarification. Should we see a critical lab result, you will be contacted sooner.   If You Need Anything After Your Visit  If you have any questions or concerns for your doctor, please call our main line at [phone] If no one answers, please leave a voicemail as directed and we will return your call as soon as possible. Messages left after 4 pm will be answered the following business day.   You may also send us  a message via MyChart. We typically respond to MyChart messages within 1-2 business days.  For prescription refills, please ask your pharmacy to contact our office. Our fax number is [phone].  If you have an urgent issue when the clinic is closed that cannot wait until the next business day, you can page your doctor at the number below.    Please note that while we do our best to be available for urgent issues outside of office hours, we are not available 24/7.   If you have an urgent issue and are unable to reach us , you may choose to seek medical care at your doctor's office, retail clinic, urgent care center, or emergency room.  If you have a medical emergency, please immediately call 911 or go to the emergency department. In the event of inclement weather, please call our main line at [phone] for an update on the status of any delays or closures.  Dermatology Medication Tips: Please keep the boxes that topical medications come in in order to help keep track of the instructions about where and how to use these. Pharmacies  typically print the medication instructions only on the boxes and not directly on the medication tubes.   If your medication is too expensive, please contact our office at [phone] or send us  a message through MyChart.   We are unable to tell what your co-pay for medications will be in advance as this is different depending on your insurance coverage. However, we may be able to find a substitute medication at lower cost or fill out paperwork to get insurance to cover a needed medication.   If a prior authorization is required to get your medication covered by your insurance company, please allow us  1-2 business days to complete this process.  Drug prices often vary depending on where the prescription is filled and some pharmacies may offer cheaper prices.  The website www.goodrx.com contains coupons for medications through different pharmacies. The prices here do not account for what the cost may be with help from insurance (it may be cheaper with your insurance), but the website can give you the price if you did not use any insurance.  - You can print the associated coupon and take it with your prescription to the pharmacy.  - You may also stop by our office during regular business hours and pick up a GoodRx coupon card.  -  If you need your prescription sent electronically to a different pharmacy, notify our office through [facility] or by phone at [phone]

## 2024-04-03 LAB — SURGICAL PATHOLOGY

## 2024-04-09 ENCOUNTER — Ambulatory Visit: Payer: Self-pay | Admitting: Dermatology

## 2024-04-09 NOTE — Progress Notes (Signed)
 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  Results and treatment plan will be discussed in detail with patient at her follow up / SR[facility] visit next week.

## 2024-04-16 ENCOUNTER — Ambulatory Visit: Admitting: Dermatology
